# Patient Record
Sex: Female | Born: 1940 | Race: White | Hispanic: No | Marital: Married | Smoking: Never smoker
Health system: Southern US, Community
[De-identification: ages and names within clinical notes are randomized; demographics above are authoritative.]

## PROBLEM LIST (undated history)

## (undated) DIAGNOSIS — R32 Unspecified urinary incontinence: Secondary | ICD-10-CM

## (undated) DIAGNOSIS — R7303 Prediabetes: Secondary | ICD-10-CM

## (undated) DIAGNOSIS — K635 Polyp of colon: Secondary | ICD-10-CM

## (undated) DIAGNOSIS — C439 Malignant melanoma of skin, unspecified: Secondary | ICD-10-CM

## (undated) DIAGNOSIS — N952 Postmenopausal atrophic vaginitis: Secondary | ICD-10-CM

## (undated) DIAGNOSIS — D219 Benign neoplasm of connective and other soft tissue, unspecified: Secondary | ICD-10-CM

## (undated) DIAGNOSIS — J45909 Unspecified asthma, uncomplicated: Secondary | ICD-10-CM

## (undated) DIAGNOSIS — N3946 Mixed incontinence: Secondary | ICD-10-CM

## (undated) DIAGNOSIS — E7800 Pure hypercholesterolemia, unspecified: Secondary | ICD-10-CM

## (undated) DIAGNOSIS — K219 Gastro-esophageal reflux disease without esophagitis: Secondary | ICD-10-CM

## (undated) DIAGNOSIS — E119 Type 2 diabetes mellitus without complications: Secondary | ICD-10-CM

## (undated) DIAGNOSIS — F329 Major depressive disorder, single episode, unspecified: Secondary | ICD-10-CM

## (undated) DIAGNOSIS — M1990 Unspecified osteoarthritis, unspecified site: Secondary | ICD-10-CM

## (undated) DIAGNOSIS — IMO0002 Reserved for concepts with insufficient information to code with codable children: Secondary | ICD-10-CM

## (undated) DIAGNOSIS — N393 Stress incontinence (female) (male): Secondary | ICD-10-CM

## (undated) DIAGNOSIS — D509 Iron deficiency anemia, unspecified: Secondary | ICD-10-CM

## (undated) DIAGNOSIS — Z8719 Personal history of other diseases of the digestive system: Secondary | ICD-10-CM

## (undated) DIAGNOSIS — I10 Essential (primary) hypertension: Secondary | ICD-10-CM

## (undated) DIAGNOSIS — T8859XA Other complications of anesthesia, initial encounter: Secondary | ICD-10-CM

## (undated) DIAGNOSIS — R1032 Left lower quadrant pain: Secondary | ICD-10-CM

## (undated) DIAGNOSIS — F419 Anxiety disorder, unspecified: Secondary | ICD-10-CM

## (undated) DIAGNOSIS — M722 Plantar fascial fibromatosis: Secondary | ICD-10-CM

## (undated) DIAGNOSIS — F32A Depression, unspecified: Secondary | ICD-10-CM

## (undated) DIAGNOSIS — N816 Rectocele: Secondary | ICD-10-CM

## (undated) HISTORY — PX: KNEE ARTHROSCOPY: SUR90

## (undated) HISTORY — DX: Stress incontinence (female) (male): N39.3

## (undated) HISTORY — PX: TUBAL LIGATION: SHX77

## (undated) HISTORY — DX: Reserved for concepts with insufficient information to code with codable children: IMO0002

## (undated) HISTORY — DX: Depression, unspecified: F32.A

## (undated) HISTORY — DX: Postmenopausal atrophic vaginitis: N95.2

## (undated) HISTORY — DX: Benign neoplasm of connective and other soft tissue, unspecified: D21.9

## (undated) HISTORY — DX: Anxiety disorder, unspecified: F41.9

## (undated) HISTORY — DX: Gastro-esophageal reflux disease without esophagitis: K21.9

## (undated) HISTORY — DX: Pure hypercholesterolemia, unspecified: E78.00

## (undated) HISTORY — DX: Major depressive disorder, single episode, unspecified: F32.9

## (undated) HISTORY — DX: Malignant melanoma of skin, unspecified: C43.9

## (undated) HISTORY — PX: OTHER SURGICAL HISTORY: SHX169

## (undated) HISTORY — PX: ORIF FEMUR FRACTURE: SHX2119

## (undated) HISTORY — DX: Essential (primary) hypertension: I10

## (undated) HISTORY — DX: Unspecified urinary incontinence: R32

## (undated) HISTORY — DX: Rectocele: N81.6

## (undated) HISTORY — PX: SPINE SURGERY: SHX786

## (undated) HISTORY — PX: CHOLECYSTECTOMY: SHX55

## (undated) HISTORY — PX: TOTAL HIP ARTHROPLASTY: SHX124

## (undated) HISTORY — DX: Left lower quadrant pain: R10.32

---

## 2000-04-07 ENCOUNTER — Encounter: Payer: Self-pay | Admitting: Family Medicine

## 2000-04-07 ENCOUNTER — Encounter: Admission: RE | Admit: 2000-04-07 | Discharge: 2000-04-07 | Payer: Self-pay | Admitting: Family Medicine

## 2000-04-17 ENCOUNTER — Encounter: Admission: RE | Admit: 2000-04-17 | Discharge: 2000-07-16 | Payer: Self-pay | Admitting: Family Medicine

## 2003-10-06 ENCOUNTER — Encounter: Admission: RE | Admit: 2003-10-06 | Discharge: 2003-10-06 | Payer: Self-pay | Admitting: Family Medicine

## 2003-11-14 ENCOUNTER — Encounter (INDEPENDENT_AMBULATORY_CARE_PROVIDER_SITE_OTHER): Payer: Self-pay | Admitting: Specialist

## 2003-11-14 ENCOUNTER — Ambulatory Visit (HOSPITAL_COMMUNITY): Admission: RE | Admit: 2003-11-14 | Discharge: 2003-11-14 | Payer: Self-pay | Admitting: Gastroenterology

## 2004-09-24 ENCOUNTER — Ambulatory Visit: Payer: Self-pay | Admitting: Family Medicine

## 2005-05-06 ENCOUNTER — Ambulatory Visit: Payer: Self-pay | Admitting: Pain Medicine

## 2005-10-31 ENCOUNTER — Ambulatory Visit: Payer: Self-pay | Admitting: Family Medicine

## 2006-11-05 ENCOUNTER — Ambulatory Visit: Payer: Self-pay | Admitting: Family Medicine

## 2006-12-18 ENCOUNTER — Ambulatory Visit: Payer: Self-pay | Admitting: Family Medicine

## 2007-01-06 ENCOUNTER — Encounter: Payer: Self-pay | Admitting: Family Medicine

## 2007-01-09 ENCOUNTER — Encounter: Payer: Self-pay | Admitting: Family Medicine

## 2007-02-09 ENCOUNTER — Encounter: Payer: Self-pay | Admitting: Family Medicine

## 2007-04-15 ENCOUNTER — Ambulatory Visit: Payer: Self-pay | Admitting: Family Medicine

## 2007-10-13 ENCOUNTER — Ambulatory Visit: Payer: Self-pay | Admitting: General Practice

## 2007-11-12 ENCOUNTER — Ambulatory Visit: Payer: Self-pay | Admitting: Family Medicine

## 2007-11-17 ENCOUNTER — Ambulatory Visit: Payer: Self-pay | Admitting: Pain Medicine

## 2007-11-23 ENCOUNTER — Ambulatory Visit: Payer: Self-pay | Admitting: Pain Medicine

## 2007-12-09 ENCOUNTER — Ambulatory Visit: Payer: Self-pay | Admitting: Pain Medicine

## 2007-12-24 ENCOUNTER — Ambulatory Visit: Payer: Self-pay | Admitting: Pain Medicine

## 2007-12-28 ENCOUNTER — Ambulatory Visit: Payer: Self-pay | Admitting: Pain Medicine

## 2007-12-28 ENCOUNTER — Ambulatory Visit: Payer: Self-pay

## 2008-01-13 ENCOUNTER — Ambulatory Visit: Payer: Self-pay | Admitting: General Practice

## 2008-01-15 ENCOUNTER — Ambulatory Visit: Payer: Self-pay | Admitting: General Practice

## 2008-05-24 ENCOUNTER — Ambulatory Visit: Payer: Self-pay | Admitting: Gastroenterology

## 2008-06-10 HISTORY — PX: OTHER SURGICAL HISTORY: SHX169

## 2008-09-27 ENCOUNTER — Ambulatory Visit: Payer: Self-pay

## 2008-11-01 ENCOUNTER — Ambulatory Visit: Payer: Self-pay | Admitting: Pain Medicine

## 2008-11-14 ENCOUNTER — Ambulatory Visit: Payer: Self-pay | Admitting: Pain Medicine

## 2008-11-16 ENCOUNTER — Ambulatory Visit: Payer: Self-pay | Admitting: Family Medicine

## 2008-12-10 ENCOUNTER — Ambulatory Visit: Payer: Self-pay

## 2008-12-19 ENCOUNTER — Ambulatory Visit: Payer: Self-pay | Admitting: Pain Medicine

## 2009-04-27 ENCOUNTER — Ambulatory Visit: Payer: Self-pay | Admitting: General Practice

## 2009-05-10 ENCOUNTER — Ambulatory Visit: Payer: Self-pay | Admitting: General Practice

## 2009-09-13 ENCOUNTER — Ambulatory Visit: Payer: Self-pay | Admitting: Family Medicine

## 2009-10-05 ENCOUNTER — Ambulatory Visit: Payer: Self-pay | Admitting: Family Medicine

## 2009-10-20 ENCOUNTER — Ambulatory Visit: Payer: Self-pay | Admitting: Family Medicine

## 2009-12-13 ENCOUNTER — Inpatient Hospital Stay (HOSPITAL_COMMUNITY): Admission: RE | Admit: 2009-12-13 | Discharge: 2009-12-15 | Payer: Self-pay | Admitting: Neurosurgery

## 2010-01-09 ENCOUNTER — Encounter: Payer: Self-pay | Admitting: Family Medicine

## 2010-02-08 ENCOUNTER — Encounter: Payer: Self-pay | Admitting: Family Medicine

## 2010-04-03 ENCOUNTER — Inpatient Hospital Stay (HOSPITAL_COMMUNITY): Admission: RE | Admit: 2010-04-03 | Discharge: 2010-04-10 | Payer: Self-pay | Admitting: Orthopaedic Surgery

## 2010-04-05 ENCOUNTER — Encounter (INDEPENDENT_AMBULATORY_CARE_PROVIDER_SITE_OTHER): Payer: Self-pay | Admitting: Internal Medicine

## 2010-04-18 ENCOUNTER — Ambulatory Visit: Payer: Self-pay | Admitting: Vascular Surgery

## 2010-04-18 ENCOUNTER — Ambulatory Visit (HOSPITAL_COMMUNITY): Admission: RE | Admit: 2010-04-18 | Discharge: 2010-04-18 | Payer: Self-pay | Admitting: Orthopaedic Surgery

## 2010-04-18 ENCOUNTER — Encounter (INDEPENDENT_AMBULATORY_CARE_PROVIDER_SITE_OTHER): Payer: Self-pay | Admitting: Orthopaedic Surgery

## 2010-04-27 ENCOUNTER — Inpatient Hospital Stay (HOSPITAL_COMMUNITY)
Admission: AD | Admit: 2010-04-27 | Discharge: 2010-05-07 | Payer: Self-pay | Source: Home / Self Care | Admitting: Orthopaedic Surgery

## 2010-05-02 ENCOUNTER — Ambulatory Visit: Payer: Self-pay | Admitting: Physical Medicine & Rehabilitation

## 2010-05-07 ENCOUNTER — Inpatient Hospital Stay
Admission: AD | Admit: 2010-05-07 | Discharge: 2010-05-28 | Payer: Self-pay | Source: Home / Self Care | Attending: Internal Medicine | Admitting: Internal Medicine

## 2010-07-01 ENCOUNTER — Encounter (HOSPITAL_BASED_OUTPATIENT_CLINIC_OR_DEPARTMENT_OTHER): Payer: Self-pay | Admitting: Internal Medicine

## 2010-08-07 ENCOUNTER — Ambulatory Visit: Payer: Self-pay | Admitting: Family Medicine

## 2010-08-21 LAB — CBC
HCT: 26.4 % — ABNORMAL LOW (ref 36.0–46.0)
HCT: 29.8 % — ABNORMAL LOW (ref 36.0–46.0)
HCT: 29.8 % — ABNORMAL LOW (ref 36.0–46.0)
HCT: 31.2 % — ABNORMAL LOW (ref 36.0–46.0)
Hemoglobin: 10.4 g/dL — ABNORMAL LOW (ref 12.0–15.0)
Hemoglobin: 10.4 g/dL — ABNORMAL LOW (ref 12.0–15.0)
Hemoglobin: 9.4 g/dL — ABNORMAL LOW (ref 12.0–15.0)
MCH: 29.4 pg (ref 26.0–34.0)
MCH: 29.7 pg (ref 26.0–34.0)
MCH: 29.8 pg (ref 26.0–34.0)
MCHC: 31.5 g/dL (ref 30.0–36.0)
MCHC: 31.8 g/dL (ref 30.0–36.0)
MCHC: 32.1 g/dL (ref 30.0–36.0)
MCHC: 33.3 g/dL (ref 30.0–36.0)
MCHC: 33.3 g/dL (ref 30.0–36.0)
MCHC: 34.3 g/dL (ref 30.0–36.0)
MCV: 89.4 fL (ref 78.0–100.0)
MCV: 93.7 fL (ref 78.0–100.0)
Platelets: 267 10*3/uL (ref 150–400)
Platelets: 332 10*3/uL (ref 150–400)
Platelets: 432 10*3/uL — ABNORMAL HIGH (ref 150–400)
Platelets: 446 10*3/uL — ABNORMAL HIGH (ref 150–400)
Platelets: 567 10*3/uL — ABNORMAL HIGH (ref 150–400)
RBC: 3.18 MIL/uL — ABNORMAL LOW (ref 3.87–5.11)
RBC: 3.23 MIL/uL — ABNORMAL LOW (ref 3.87–5.11)
RBC: 3.46 MIL/uL — ABNORMAL LOW (ref 3.87–5.11)
RDW: 14.6 % (ref 11.5–15.5)
RDW: 14.6 % (ref 11.5–15.5)
RDW: 15.2 % (ref 11.5–15.5)
RDW: 15.3 % (ref 11.5–15.5)
WBC: 13.2 10*3/uL — ABNORMAL HIGH (ref 4.0–10.5)
WBC: 9.1 10*3/uL (ref 4.0–10.5)

## 2010-08-21 LAB — BASIC METABOLIC PANEL
BUN: 10 mg/dL (ref 6–23)
BUN: 14 mg/dL (ref 6–23)
BUN: 17 mg/dL (ref 6–23)
CO2: 21 mEq/L (ref 19–32)
Calcium: 8.1 mg/dL — ABNORMAL LOW (ref 8.4–10.5)
Calcium: 8.4 mg/dL (ref 8.4–10.5)
Calcium: 8.5 mg/dL (ref 8.4–10.5)
Chloride: 107 mEq/L (ref 96–112)
Creatinine, Ser: 0.73 mg/dL (ref 0.4–1.2)
Creatinine, Ser: 0.78 mg/dL (ref 0.4–1.2)
Creatinine, Ser: 0.84 mg/dL (ref 0.4–1.2)
Creatinine, Ser: 0.84 mg/dL (ref 0.4–1.2)
GFR calc Af Amer: >60 mL/min (ref >60)
GFR calc Af Amer: >60 mL/min (ref >60)
GFR calc non Af Amer: >60 mL/min (ref >60)
GFR calc non Af Amer: >60 mL/min (ref >60)
GFR calc non Af Amer: >60 mL/min (ref >60)
Glucose, Bld: 117 mg/dL — ABNORMAL HIGH (ref 70–99)
Glucose, Bld: 140 mg/dL — ABNORMAL HIGH (ref 70–99)
Glucose, Bld: 212 mg/dL — ABNORMAL HIGH (ref 70–99)
Potassium: 3.5 mEq/L (ref 3.5–5.1)
Potassium: 4.4 mEq/L (ref 3.5–5.1)
Sodium: 132 mEq/L — ABNORMAL LOW (ref 135–145)
Sodium: 136 mEq/L (ref 135–145)

## 2010-08-21 LAB — CROSSMATCH
ABO/RH(D): A POS
Antibody Screen: NEGATIVE
Unit division: 0
Unit division: 0
Unit division: 0

## 2010-08-21 LAB — URINALYSIS, MICROSCOPIC ONLY
Ketones, ur: NEGATIVE mg/dL
Nitrite: NEGATIVE
Protein, ur: NEGATIVE mg/dL
Urobilinogen, UA: >8 mg/dL — ABNORMAL HIGH (ref 0.0–1.0)

## 2010-08-21 LAB — PROTEIN ELECTROPH W RFLX QUANT IMMUNOGLOBULINS
Albumin ELP: 55.3 % — ABNORMAL LOW (ref 55.8–66.1)
Alpha-1-Globulin: 7.1 % — ABNORMAL HIGH (ref 2.9–4.9)
Alpha-2-Globulin: 14.6 % — ABNORMAL HIGH (ref 7.1–11.8)
Beta 2: 5.5 % (ref 3.2–6.5)
Beta Globulin: 6.3 % (ref 4.7–7.2)
Gamma Globulin: 11.2 % (ref 11.1–18.8)

## 2010-08-21 LAB — COMPREHENSIVE METABOLIC PANEL
ALT: 22 U/L (ref 0–35)
AST: 22 U/L (ref 0–37)
Albumin: 3.6 g/dL (ref 3.5–5.2)
BUN: 14 mg/dL (ref 6–23)
CO2: 25 mEq/L (ref 19–32)
CO2: 28 mEq/L (ref 19–32)
Calcium: 9.2 mg/dL (ref 8.4–10.5)
Calcium: 9.8 mg/dL (ref 8.4–10.5)
Creatinine, Ser: 0.73 mg/dL (ref 0.4–1.2)
Creatinine, Ser: 1.14 mg/dL (ref 0.4–1.2)
GFR calc Af Amer: 57 mL/min — ABNORMAL LOW (ref >60)
GFR calc Af Amer: >60 mL/min (ref >60)
GFR calc non Af Amer: 47 mL/min — ABNORMAL LOW (ref >60)
GFR calc non Af Amer: >60 mL/min (ref >60)
Glucose, Bld: 96 mg/dL (ref 70–99)
Sodium: 140 mEq/L (ref 135–145)
Total Protein: 5.6 g/dL — ABNORMAL LOW (ref 6.0–8.3)

## 2010-08-21 LAB — URINALYSIS, ROUTINE W REFLEX MICROSCOPIC
Glucose, UA: NEGATIVE mg/dL
Hgb urine dipstick: NEGATIVE
Ketones, ur: NEGATIVE mg/dL
pH: 6 (ref 5.0–8.0)

## 2010-08-21 LAB — WOUND CULTURE: Culture: NO GROWTH

## 2010-08-21 LAB — IRON AND TIBC: Saturation Ratios: 5 % — ABNORMAL LOW (ref 20–55)

## 2010-08-21 LAB — URINE CULTURE
Colony Count: >=100000
Culture  Setup Time: 201111212041

## 2010-08-21 LAB — PROTEIN, URINE, 24 HOUR: Protein, 24H Urine: 238 mg/d — ABNORMAL HIGH (ref 50–100)

## 2010-08-21 LAB — VITAMIN B12: Vitamin B-12: 284 pg/mL (ref 211–911)

## 2010-08-21 LAB — RETICULOCYTES
RBC.: 3.36 MIL/uL — ABNORMAL LOW (ref 3.87–5.11)
Retic Count, Absolute: 104.2 10*3/uL (ref 19.0–186.0)
Retic Ct Pct: 3.1 % (ref 0.4–3.1)

## 2010-08-21 LAB — PROTIME-INR
INR: 1.04 (ref 0.00–1.49)
INR: 1.5 — ABNORMAL HIGH (ref 0.00–1.49)
Prothrombin Time: 12.9 seconds (ref 11.6–15.2)

## 2010-08-21 LAB — VITAMIN D 25 HYDROXY (VIT D DEFICIENCY, FRACTURES): Vit D, 25-Hydroxy: 26 ng/mL — ABNORMAL LOW (ref 30–89)

## 2010-08-21 LAB — POCT I-STAT 4, (NA,K, GLUC, HGB,HCT)
Glucose, Bld: 120 mg/dL — ABNORMAL HIGH (ref 70–99)
Potassium: 4.4 mEq/L (ref 3.5–5.1)
Sodium: 139 mEq/L (ref 135–145)

## 2010-08-21 LAB — TSH: TSH: 1.051 u[IU]/mL (ref 0.350–4.500)

## 2010-08-21 LAB — VITAMIN D 1,25 DIHYDROXY: Vitamin D2 1, 25 (OH)2: <8 pg/mL

## 2010-08-21 LAB — APTT
aPTT: 26 seconds (ref 24–37)
aPTT: 33 seconds (ref 24–37)

## 2010-08-21 LAB — PREPARE RBC (CROSSMATCH)

## 2010-08-21 LAB — ANAEROBIC CULTURE

## 2010-08-22 LAB — CROSSMATCH
ABO/RH(D): A POS
Antibody Screen: NEGATIVE
Unit division: 0

## 2010-08-22 LAB — COMPREHENSIVE METABOLIC PANEL
ALT: 20 U/L (ref 0–35)
Albumin: 2.4 g/dL — ABNORMAL LOW (ref 3.5–5.2)
Alkaline Phosphatase: 48 U/L (ref 39–117)
Alkaline Phosphatase: 51 U/L (ref 39–117)
BUN: 15 mg/dL (ref 6–23)
BUN: 25 mg/dL — ABNORMAL HIGH (ref 6–23)
CO2: 23 mEq/L (ref 19–32)
Chloride: 106 mEq/L (ref 96–112)
Chloride: 111 mEq/L (ref 96–112)
GFR calc non Af Amer: >60 mL/min (ref >60)
Glucose, Bld: 105 mg/dL — ABNORMAL HIGH (ref 70–99)
Glucose, Bld: 107 mg/dL — ABNORMAL HIGH (ref 70–99)
Potassium: 3.8 mEq/L (ref 3.5–5.1)
Potassium: 4.1 mEq/L (ref 3.5–5.1)
Sodium: 139 mEq/L (ref 135–145)
Total Bilirubin: 0.5 mg/dL (ref 0.3–1.2)
Total Bilirubin: 0.6 mg/dL (ref 0.3–1.2)

## 2010-08-22 LAB — CBC
HCT: 24 % — ABNORMAL LOW (ref 36.0–46.0)
HCT: 24.4 % — ABNORMAL LOW (ref 36.0–46.0)
HCT: 25 % — ABNORMAL LOW (ref 36.0–46.0)
HCT: 27.1 % — ABNORMAL LOW (ref 36.0–46.0)
HCT: 36.6 % (ref 36.0–46.0)
Hemoglobin: 12 g/dL (ref 12.0–15.0)
Hemoglobin: 7.9 g/dL — ABNORMAL LOW (ref 12.0–15.0)
Hemoglobin: 8.4 g/dL — ABNORMAL LOW (ref 12.0–15.0)
MCH: 29.4 pg (ref 26.0–34.0)
MCH: 30 pg (ref 26.0–34.0)
MCH: 30.2 pg (ref 26.0–34.0)
MCHC: 32.8 g/dL (ref 30.0–36.0)
MCHC: 32.9 g/dL (ref 30.0–36.0)
MCV: 87.6 fL (ref 78.0–100.0)
MCV: 89.1 fL (ref 78.0–100.0)
MCV: 89.2 fL (ref 78.0–100.0)
MCV: 89.9 fL (ref 78.0–100.0)
MCV: 91.5 fL (ref 78.0–100.0)
Platelets: 192 10*3/uL (ref 150–400)
Platelets: 203 10*3/uL (ref 150–400)
Platelets: 217 10*3/uL (ref 150–400)
Platelets: 233 10*3/uL (ref 150–400)
Platelets: 370 K/uL (ref 150–400)
RBC: 2.73 MIL/uL — ABNORMAL LOW (ref 3.87–5.11)
RBC: 2.79 MIL/uL — ABNORMAL LOW (ref 3.87–5.11)
RBC: 2.81 MIL/uL — ABNORMAL LOW (ref 3.87–5.11)
RBC: 2.9 MIL/uL — ABNORMAL LOW (ref 3.87–5.11)
RBC: 3.04 MIL/uL — ABNORMAL LOW (ref 3.87–5.11)
RBC: 4 MIL/uL (ref 3.87–5.11)
RDW: 13.4 % (ref 11.5–15.5)
RDW: 13.9 % (ref 11.5–15.5)
RDW: 14 % (ref 11.5–15.5)
RDW: 14.1 % (ref 11.5–15.5)
RDW: 14.2 % (ref 11.5–15.5)
WBC: 7.4 10*3/uL (ref 4.0–10.5)
WBC: 7.7 K/uL (ref 4.0–10.5)
WBC: 8.3 10*3/uL (ref 4.0–10.5)
WBC: 8.8 10*3/uL (ref 4.0–10.5)

## 2010-08-22 LAB — BASIC METABOLIC PANEL
BUN: 13 mg/dL (ref 6–23)
CO2: 24 mEq/L (ref 19–32)
CO2: 25 mEq/L (ref 19–32)
Calcium: 8.2 mg/dL — ABNORMAL LOW (ref 8.4–10.5)
Calcium: 8.5 mg/dL (ref 8.4–10.5)
Chloride: 107 mEq/L (ref 96–112)
Chloride: 108 mEq/L (ref 96–112)
Chloride: 112 mEq/L (ref 96–112)
Creatinine, Ser: 2.29 mg/dL — ABNORMAL HIGH (ref 0.4–1.2)
GFR calc Af Amer: 25 mL/min — ABNORMAL LOW (ref >60)
GFR calc Af Amer: >60 mL/min (ref >60)
GFR calc Af Amer: >60 mL/min (ref >60)
GFR calc non Af Amer: 21 mL/min — ABNORMAL LOW (ref >60)
GFR calc non Af Amer: >60 mL/min (ref >60)
GFR calc non Af Amer: >60 mL/min (ref >60)
Glucose, Bld: 126 mg/dL — ABNORMAL HIGH (ref 70–99)
Potassium: 3.5 mEq/L (ref 3.5–5.1)
Potassium: 3.7 mEq/L (ref 3.5–5.1)
Potassium: 5.1 mEq/L (ref 3.5–5.1)
Sodium: 138 mEq/L (ref 135–145)
Sodium: 141 mEq/L (ref 135–145)

## 2010-08-22 LAB — URINE CULTURE

## 2010-08-22 LAB — HEMOGLOBIN AND HEMATOCRIT, BLOOD: Hemoglobin: 8.9 g/dL — ABNORMAL LOW (ref 12.0–15.0)

## 2010-08-22 LAB — URINE MICROSCOPIC-ADD ON

## 2010-08-22 LAB — COMPREHENSIVE METABOLIC PANEL WITH GFR
ALT: 18 U/L (ref 0–35)
AST: 20 U/L (ref 0–37)
Albumin: 4.3 g/dL (ref 3.5–5.2)
Alkaline Phosphatase: 81 U/L (ref 39–117)
BUN: 19 mg/dL (ref 6–23)
CO2: 27 meq/L (ref 19–32)
Calcium: 10 mg/dL (ref 8.4–10.5)
Chloride: 104 meq/L (ref 96–112)
Creatinine, Ser: 1.21 mg/dL — ABNORMAL HIGH (ref 0.4–1.2)
GFR calc non Af Amer: 44 mL/min — ABNORMAL LOW
Glucose, Bld: 91 mg/dL (ref 70–99)
Potassium: 3.9 meq/L (ref 3.5–5.1)
Sodium: 139 meq/L (ref 135–145)
Total Bilirubin: 0.8 mg/dL (ref 0.3–1.2)
Total Protein: 7.1 g/dL (ref 6.0–8.3)

## 2010-08-22 LAB — DIFFERENTIAL
Basophils Relative: 1 % (ref 0–1)
Eosinophils Absolute: 0.1 10*3/uL (ref 0.0–0.7)
Eosinophils Relative: 1 % (ref 0–5)
Monocytes Relative: 6 % (ref 3–12)
Neutrophils Relative %: 54 % (ref 43–77)

## 2010-08-22 LAB — CK
Total CK: 461 U/L — ABNORMAL HIGH (ref 7–177)
Total CK: 701 U/L — ABNORMAL HIGH (ref 7–177)

## 2010-08-22 LAB — CULTURE, BLOOD (ROUTINE X 2)
Culture  Setup Time: 201110272115
Culture: NO GROWTH

## 2010-08-22 LAB — MRSA PCR SCREENING: MRSA by PCR: NEGATIVE

## 2010-08-22 LAB — URINALYSIS, ROUTINE W REFLEX MICROSCOPIC
Bilirubin Urine: NEGATIVE
Glucose, UA: NEGATIVE mg/dL
Hgb urine dipstick: NEGATIVE
Hgb urine dipstick: NEGATIVE
Ketones, ur: NEGATIVE mg/dL
Nitrite: NEGATIVE
Protein, ur: NEGATIVE mg/dL
Specific Gravity, Urine: 1.013 (ref 1.005–1.030)
Urobilinogen, UA: 1 mg/dL (ref 0.0–1.0)
pH: 7 (ref 5.0–8.0)

## 2010-08-22 LAB — CARDIAC PANEL(CRET KIN+CKTOT+MB+TROPI)
Relative Index: 1.8 (ref 0.0–2.5)
Total CK: 3603 U/L — ABNORMAL HIGH (ref 7–177)
Troponin I: 0.02 ng/mL (ref 0.00–0.06)
Troponin I: 0.03 ng/mL (ref 0.00–0.06)

## 2010-08-22 LAB — SURGICAL PCR SCREEN
MRSA, PCR: NEGATIVE
Staphylococcus aureus: NEGATIVE

## 2010-08-22 LAB — PROTIME-INR: Prothrombin Time: 13 seconds (ref 11.6–15.2)

## 2010-08-26 LAB — COMPREHENSIVE METABOLIC PANEL
ALT: 24 U/L (ref 0–35)
AST: 24 U/L (ref 0–37)
Alkaline Phosphatase: 67 U/L (ref 39–117)
CO2: 25 mEq/L (ref 19–32)
Chloride: 103 mEq/L (ref 96–112)
GFR calc Af Amer: 59 mL/min — ABNORMAL LOW (ref >60)
GFR calc non Af Amer: 48 mL/min — ABNORMAL LOW (ref >60)
Potassium: 4.4 mEq/L (ref 3.5–5.1)
Sodium: 138 mEq/L (ref 135–145)
Total Bilirubin: 0.4 mg/dL (ref 0.3–1.2)

## 2010-08-26 LAB — DIFFERENTIAL
Basophils Absolute: 0.1 10*3/uL (ref 0.0–0.1)
Eosinophils Absolute: 0.1 10*3/uL (ref 0.0–0.7)
Eosinophils Relative: 2 % (ref 0–5)
Lymphs Abs: 2.4 10*3/uL (ref 0.7–4.0)

## 2010-08-26 LAB — CBC
Hemoglobin: 11.1 g/dL — ABNORMAL LOW (ref 12.0–15.0)
RBC: 3.59 MIL/uL — ABNORMAL LOW (ref 3.87–5.11)

## 2010-08-26 LAB — URINALYSIS, ROUTINE W REFLEX MICROSCOPIC
Bilirubin Urine: NEGATIVE
Nitrite: NEGATIVE
Specific Gravity, Urine: 1.02 (ref 1.005–1.030)
pH: 7 (ref 5.0–8.0)

## 2010-08-26 LAB — SURGICAL PCR SCREEN: MRSA, PCR: NEGATIVE

## 2010-08-26 LAB — TYPE AND SCREEN: Antibody Screen: NEGATIVE

## 2010-08-26 LAB — PROTIME-INR: Prothrombin Time: 12.5 seconds (ref 11.6–15.2)

## 2010-08-31 ENCOUNTER — Other Ambulatory Visit (HOSPITAL_COMMUNITY): Payer: Self-pay | Admitting: Orthopaedic Surgery

## 2010-08-31 DIAGNOSIS — S7291XA Unspecified fracture of right femur, initial encounter for closed fracture: Secondary | ICD-10-CM

## 2010-09-04 ENCOUNTER — Ambulatory Visit (HOSPITAL_COMMUNITY)
Admission: RE | Admit: 2010-09-04 | Discharge: 2010-09-04 | Disposition: A | Discharge status: Home / Self Care | Payer: Medicare Other | Source: Ambulatory Visit | Attending: Orthopaedic Surgery | Admitting: Orthopaedic Surgery

## 2010-09-04 ENCOUNTER — Encounter (HOSPITAL_COMMUNITY)
Admission: RE | Admit: 2010-09-04 | Discharge: 2010-09-04 | Disposition: A | Discharge status: Home / Self Care | Payer: Medicare Other | Source: Ambulatory Visit | Attending: Orthopaedic Surgery | Admitting: Orthopaedic Surgery

## 2010-09-04 DIAGNOSIS — S92009A Unspecified fracture of unspecified calcaneus, initial encounter for closed fracture: Secondary | ICD-10-CM | POA: Insufficient documentation

## 2010-09-04 DIAGNOSIS — S7291XA Unspecified fracture of right femur, initial encounter for closed fracture: Secondary | ICD-10-CM

## 2010-09-04 DIAGNOSIS — Z96649 Presence of unspecified artificial hip joint: Secondary | ICD-10-CM | POA: Insufficient documentation

## 2010-09-04 DIAGNOSIS — X58XXXA Exposure to other specified factors, initial encounter: Secondary | ICD-10-CM | POA: Insufficient documentation

## 2010-09-04 DIAGNOSIS — S7290XA Unspecified fracture of unspecified femur, initial encounter for closed fracture: Secondary | ICD-10-CM | POA: Insufficient documentation

## 2010-09-04 DIAGNOSIS — S92309A Fracture of unspecified metatarsal bone(s), unspecified foot, initial encounter for closed fracture: Secondary | ICD-10-CM | POA: Insufficient documentation

## 2010-09-04 DIAGNOSIS — S92209A Fracture of unspecified tarsal bone(s) of unspecified foot, initial encounter for closed fracture: Secondary | ICD-10-CM | POA: Insufficient documentation

## 2010-09-04 MED ORDER — TECHNETIUM TC 99M MEDRONATE IV KIT
25.0000 | PACK | Freq: Once | INTRAVENOUS | Status: AC | PRN
  Administered 2010-09-04: 25 via INTRAVENOUS

## 2010-10-26 NOTE — Op Note (Signed)
Vanessa Newman, Vanessa Newman NO.:  [id number]   MEDICAL RECORD NO.:  [id number]                   PATIENT TYPE:  AMB   LOCATION:  ENDO                                 FACILITY:  [facility]   PHYSICIAN:  Danise Edge, M.D.                DATE OF BIRTH:  [birth year]   DATE OF PROCEDURE:  11/14/2003  DATE OF DISCHARGE:                                 OPERATIVE REPORT   PROCEDURE:  Screening colonoscopy.   REFERRING PHYSICIAN:  Clydie Braun L. Hal Hope, M.D. of [facility].   INDICATIONS FOR PROCEDURE:  Ms. Vanessa Newman is a 70 year old female born on  January 27, 1941.  Ms. Vanessa Newman is scheduled to undergo her first screening  colonoscopy with polypectomy to prevent colon cancer.   ENDOSCOPIST:  Danise Edge, M.D.   PREMEDICATION:  Versed 7.5 mg, Demerol 50 mg.   PROCEDURE:  After obtaining informed consent, Ms. Vanessa Newman was placed in the  left lateral decubitus position.  I administered intravenous Demerol and  intravenous Versed to achieve conscious sedation for the procedure.  The  patient's blood pressure, oxygen saturation, and cardiac rhythm were  monitored throughout the procedure and documented in the medical record.   Anal inspection and digital rectal exam was normal.  The Olympus adjustable  pediatric colonoscope was introduced into the rectum and advanced to the  cecum.  Colonic preparation for the exam today was satisfactory.   RECTUM:  Normal.   SIGMOID COLON/DESCENDING COLON:  Left colonic diverticulosis.   SPLENIC FLEXURE:  Normal.   TRANSVERSE COLON:  Normal.   HEPATIC FLEXURE:  Normal.   ASCENDING COLON:  Normal.   CECUM AND ILEOCECAL VALVE:  From the distal cecum, a 1 mm sessile polyp was  removed with cold biopsy forceps.   ASSESSMENT:  1. Left colonic diverticulosis.  2. A diminutive polyp was removed from the distal cecum.                                               Danise Edge, M.D.    MJ/MEDQ  D:  11/14/2003   T:  11/14/2003  Job:  [id number]   cc:   Clydie Braun L. Hal Hope, M.D.  [address] [place]  [place] [address]  Fax: [phone]

## 2011-04-27 IMAGING — CR DG PORTABLE PELVIS
1 series · 1 of 1 positions shown · non-contrast
Comparison: 04/03/2010.

CLINICAL DATA: Right femur fracture.

PORTABLE PELVIS,PORTABLE RIGHT FEMUR - 2 VIEW

[AP]
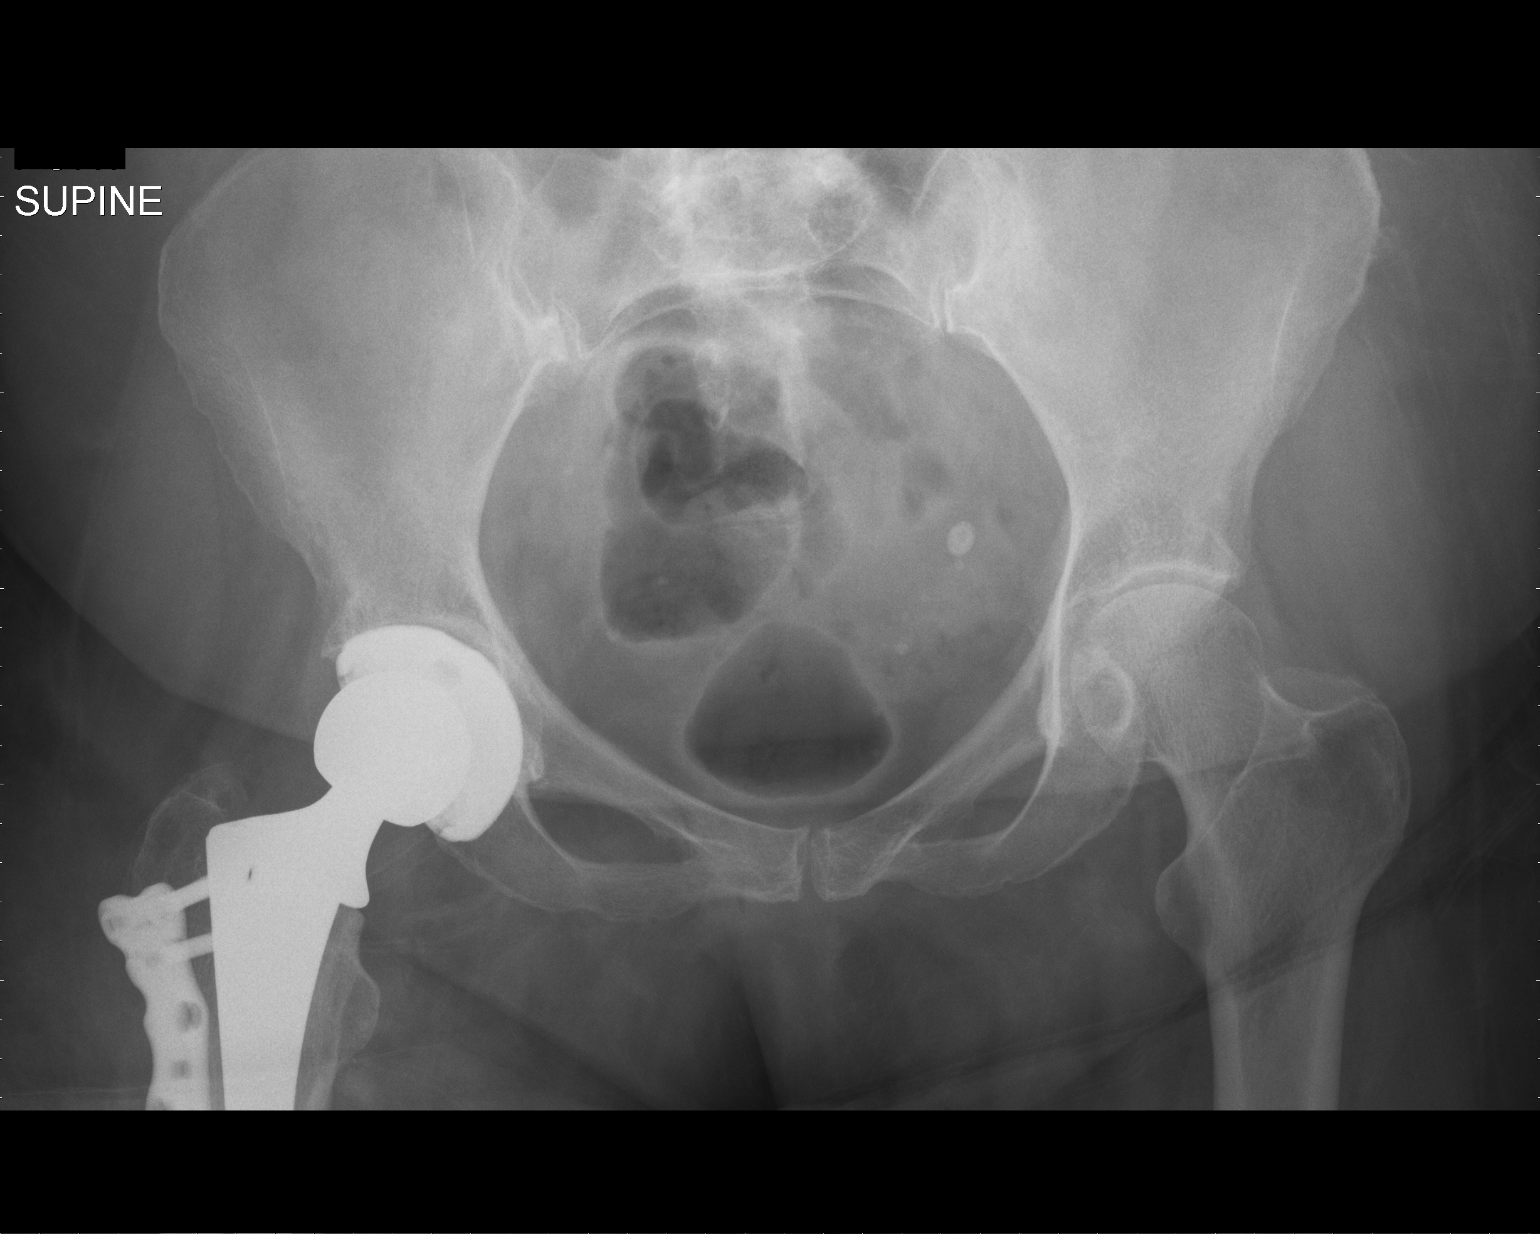

[1 of 1 positions shown; findings below may reference images not displayed]

FINDINGS: Prior right hip replacement.  Interval placement of a
long side plate, screws and poor cerclage wires along the right
femoral shaft.  Evaluation  of the proximal femur on cross-table
lateral view is slightly under penetrated.

Significant degenerative changes lateral right tibial femoral joint
space.
IMPRESSION: Prior right hip replacement.

Interval placement of a long side plate, screws and poor cerclage
wires along the right femoral shaft.

Significant degenerative changes lateral right tibial femoral joint
space.

## 2011-06-11 HISTORY — PX: TOTAL HIP ARTHROPLASTY: SHX124

## 2011-06-21 ENCOUNTER — Ambulatory Visit: Payer: Self-pay | Admitting: Unknown Physician Specialty

## 2011-08-09 ENCOUNTER — Ambulatory Visit: Payer: Self-pay | Admitting: Family Medicine

## 2011-08-13 ENCOUNTER — Ambulatory Visit: Payer: Self-pay | Admitting: Unknown Physician Specialty

## 2011-08-13 LAB — URINALYSIS, COMPLETE
Bilirubin,UR: NEGATIVE
Glucose,UR: NEGATIVE mg/dL (ref 0–75)
Ketone: NEGATIVE
Protein: NEGATIVE
RBC,UR: 8 /HPF (ref 0–5)
Squamous Epithelial: 2
WBC UR: 1 /HPF (ref 0–5)

## 2011-08-13 LAB — BASIC METABOLIC PANEL
Chloride: 106 mmol/L (ref 98–107)
Co2: 28 mmol/L (ref 21–32)
EGFR (African American): >60
Glucose: 96 mg/dL (ref 65–99)

## 2011-08-13 LAB — CBC
HGB: 11.4 g/dL — ABNORMAL LOW (ref 12.0–16.0)
MCH: 28.1 pg (ref 26.0–34.0)
MCV: 87 fL (ref 80–100)
RBC: 4.07 10*6/uL (ref 3.80–5.20)

## 2011-08-13 LAB — MRSA PCR SCREENING

## 2011-08-13 LAB — PROTIME-INR: Prothrombin Time: 12.2 secs (ref 11.5–14.7)

## 2011-08-28 ENCOUNTER — Inpatient Hospital Stay: Payer: Self-pay | Admitting: Unknown Physician Specialty

## 2011-08-28 LAB — ELECTROLYTE PANEL
Anion Gap: 12 (ref 7–16)
Co2: 27 mmol/L (ref 21–32)
Potassium: 3.7 mmol/L (ref 3.5–5.1)
Sodium: 144 mmol/L (ref 136–145)

## 2011-08-28 LAB — HEMOGLOBIN: HGB: 9.9 g/dL — ABNORMAL LOW (ref 12.0–16.0)

## 2011-08-29 LAB — BASIC METABOLIC PANEL
Calcium, Total: 8.1 mg/dL — ABNORMAL LOW (ref 8.5–10.1)
Chloride: 102 mmol/L (ref 98–107)
Co2: 28 mmol/L (ref 21–32)
EGFR (African American): >60
Glucose: 122 mg/dL — ABNORMAL HIGH (ref 65–99)
Potassium: 3.7 mmol/L (ref 3.5–5.1)
Sodium: 140 mmol/L (ref 136–145)

## 2011-08-29 LAB — HEMOGLOBIN: HGB: 9.2 g/dL — ABNORMAL LOW (ref 12.0–16.0)

## 2011-12-27 ENCOUNTER — Emergency Department: Payer: Self-pay | Admitting: Emergency Medicine

## 2011-12-27 LAB — BASIC METABOLIC PANEL
Anion Gap: 7 (ref 7–16)
BUN: 18 mg/dL (ref 7–18)
Chloride: 103 mmol/L (ref 98–107)
Creatinine: 1.04 mg/dL (ref 0.60–1.30)
EGFR (African American): >60
Osmolality: 281 (ref 275–301)

## 2011-12-27 LAB — CBC
MCH: 26.3 pg (ref 26.0–34.0)
MCV: 83 fL (ref 80–100)
Platelet: 344 10*3/uL (ref 150–440)
RBC: 4.12 10*6/uL (ref 3.80–5.20)

## 2012-07-27 ENCOUNTER — Ambulatory Visit: Payer: Self-pay | Admitting: Ophthalmology

## 2012-07-27 LAB — POTASSIUM: Potassium: 4 mmol/L (ref 3.5–5.1)

## 2012-08-12 ENCOUNTER — Ambulatory Visit: Payer: Self-pay | Admitting: Family Medicine

## 2012-08-19 ENCOUNTER — Ambulatory Visit: Payer: Self-pay | Admitting: Ophthalmology

## 2013-03-02 ENCOUNTER — Encounter: Payer: Self-pay | Admitting: Family Medicine

## 2013-03-10 ENCOUNTER — Encounter: Payer: Self-pay | Admitting: Family Medicine

## 2013-04-10 ENCOUNTER — Encounter: Payer: Self-pay | Admitting: Family Medicine

## 2013-05-19 ENCOUNTER — Ambulatory Visit: Payer: Self-pay | Admitting: Ophthalmology

## 2013-06-17 ENCOUNTER — Ambulatory Visit: Payer: Self-pay | Admitting: Gastroenterology

## 2013-08-13 ENCOUNTER — Ambulatory Visit: Payer: Self-pay | Admitting: Family Medicine

## 2013-09-09 DIAGNOSIS — M7918 Myalgia, other site: Secondary | ICD-10-CM | POA: Insufficient documentation

## 2013-09-09 DIAGNOSIS — F329 Major depressive disorder, single episode, unspecified: Secondary | ICD-10-CM | POA: Insufficient documentation

## 2013-09-09 DIAGNOSIS — K219 Gastro-esophageal reflux disease without esophagitis: Secondary | ICD-10-CM | POA: Insufficient documentation

## 2013-09-09 DIAGNOSIS — M659 Synovitis and tenosynovitis, unspecified: Secondary | ICD-10-CM | POA: Insufficient documentation

## 2013-09-09 DIAGNOSIS — G2581 Restless legs syndrome: Secondary | ICD-10-CM | POA: Insufficient documentation

## 2013-09-09 DIAGNOSIS — E785 Hyperlipidemia, unspecified: Secondary | ICD-10-CM | POA: Insufficient documentation

## 2013-09-09 DIAGNOSIS — M48061 Spinal stenosis, lumbar region without neurogenic claudication: Secondary | ICD-10-CM | POA: Insufficient documentation

## 2013-09-09 DIAGNOSIS — M722 Plantar fascial fibromatosis: Secondary | ICD-10-CM | POA: Insufficient documentation

## 2013-09-09 DIAGNOSIS — I10 Essential (primary) hypertension: Secondary | ICD-10-CM | POA: Insufficient documentation

## 2013-09-09 DIAGNOSIS — F32A Depression, unspecified: Secondary | ICD-10-CM | POA: Insufficient documentation

## 2013-09-27 DIAGNOSIS — Z85828 Personal history of other malignant neoplasm of skin: Secondary | ICD-10-CM | POA: Insufficient documentation

## 2013-09-28 ENCOUNTER — Ambulatory Visit: Payer: Self-pay

## 2013-12-21 ENCOUNTER — Encounter: Payer: Self-pay | Admitting: Obstetrics and Gynecology

## 2014-01-08 ENCOUNTER — Encounter: Payer: Self-pay | Admitting: Obstetrics and Gynecology

## 2014-01-12 DIAGNOSIS — R079 Chest pain, unspecified: Secondary | ICD-10-CM | POA: Insufficient documentation

## 2014-01-12 DIAGNOSIS — R0609 Other forms of dyspnea: Secondary | ICD-10-CM | POA: Insufficient documentation

## 2014-02-08 ENCOUNTER — Encounter: Payer: Self-pay | Admitting: Obstetrics and Gynecology

## 2014-06-07 ENCOUNTER — Ambulatory Visit: Payer: Self-pay | Admitting: Obstetrics and Gynecology

## 2014-08-15 ENCOUNTER — Ambulatory Visit: Payer: Self-pay | Admitting: Family Medicine

## 2014-09-30 NOTE — Op Note (Signed)
PATIENT NAME:  Vanessa Newman, Vanessa Newman MR#:  [id number] DATE OF BIRTH:  08/05/1940  DATE OF PROCEDURE:  08/19/2012  PREOPERATIVE DIAGNOSIS:  Senile cataract right eye.  POSTOPERATIVE DIAGNOSIS:  Senile cataract right eye.  PROCEDURE:  Phacoemulsification with posterior chamber intraocular lens implantation of the right eye.  LENS:  ZCBOO 24.0-diopter posterior chamber intraocular lens.  ULTRASOUND TIME:  15% of 1 minutes and 12 seconds.  CDE 10.9  SURGEON:  Mali Brasington, MD  ANESTHESIA:  Topical with tetracaine drops and 2% Xylocaine jelly.  COMPLICATIONS:  None.  DESCRIPTION OF PROCEDURE:  The patient was identified in the holding room and transported to the operating room and placed in the supine position under the operating microscope.  The right eye was identified as the operative eye and it was prepped and draped in the usual sterile ophthalmic fashion.  A 1 millimeter clear-corneal paracentesis was made at the 12 o'clock position.  The anterior chamber was filled withViscoat viscoelastic.  A 2.4 millimeter keratome was used to make a near-clear corneal incision at the 9 o'clock position.  A curvilinear capsulorrhexis was made with a cystotome and capsulorrhexis forceps.  Balanced salt solution was used to hydrodissect and hydrodelineate the nucleus.  Phacoemulsification was then used in stop and chop fashion to remove the lens nucleus and epinucleus.  The remaining cortex was then removed using the irrigation and aspiration handpiece. Provisc was then placed into the capsular bag to distend it for lens placement.  A ZCBOO 24.0-diopter lens was then injected into the capsular bag.  The remaining viscoelastic was aspirated.  Wounds were hydrated with balanced salt solution.  The anterior chamber was inflated to a physiologic pressure with balanced salt solution.  0.1 mL of cefuroxime 10 mg/mL were injected into the anterior chamber for a dose of 1 mg of intracameral antibiotic at the completion  of the case. Miostat was placed into the anterior chamber to constrict the pupil.  No wound leaks were noted.  Topical Vigamox drops and Maxitrol ointment were applied to the eye.  The patient was taken to the recovery room in stable condition without complications of anesthesia or surgery.     ____________________________ Wyonia Hough, MD crb:cs D: 08/19/2012 15:28:57 ET T: 08/19/2012 15:48:11 ET JOB#: [id number]  cc: Wyonia Hough, MD, <Dictator> Leandrew Koyanagi MD ELECTRONICALLY SIGNED 08/19/2012 16:13

## 2014-10-02 NOTE — Op Note (Signed)
PATIENT NAME:  Vanessa Newman, Vanessa Newman MR#:  [id number] DATE OF BIRTH:  1940/07/01  DATE OF PROCEDURE:  08/28/2011  PREOPERATIVE DIAGNOSIS: Severe degenerative arthritis, right knee.   POSTOPERATIVE DIAGNOSIS: Severe degenerative arthritis, right knee.   OPERATION: Stryker PS total knee replacement, on the right.   SURGEON: Kathrene Alu., M.D.   FIRST ASSISTANT: Dorthula Matas, PA-C  ANESTHESIA: Spinal plus general.   HISTORY: The patient had a long history of degenerative arthritis of the right knee. She had rather severe involvement of the right knee, particularly her lateral compartment.   Because the patient has been refractory to conservative treatment, she was ultimately brought in for a total knee procedure on the right.   DESCRIPTION OF PROCEDURE: The patient was taken to the Operating Room where satisfactory spinal anesthesia was achieved. A tourniquet was applied to the patient's right thigh and then the right lower extremity was prepped and draped in the usual fashion for a procedure about the knee. The patient incidentally was given 2 grams Kefzol IV prior to the start of the procedure.   Next, the right lower extremity was exsanguinated and the tourniquet was inflated. A slightly curved longitudinal incision was made over the anterior aspect of the right knee joint. Dissection was carried down through the subcutaneous tissue onto the extensor mechanism. The vastus medialis was divided in line with its fibers about a fingerbreadth above the superomedial border of the patella. The joint was entered. The incision was extended along the medial border of the patellar and medial border of the patellar tendon. The patella was everted and dislocated laterally as the knee was flexed. Inspection of the joint revealed the patient had significant osteophytes about the peripheral aspect of the distal femur and intercondylar notch. These osteophytes were removed. She had worn the lateral  femoral condyle virtually to the bone and there was a deep indentation of her lateral tibial plateau. There was some partial cartilage loss referable to the weight-bearing portion of the medial femoral condyle.    The distal femoral vertical cut was made through a cutting block that was positioned and pinned using the Stryker navigation system.  Our starting holes for the #8  4- in- 1 cutting block  were made utilizing a #9 cutting block.  We did this to try to prevent notching the anterior aspect of the distal femur.   The #8 cutting block was placed in slight external rotation. The anterior and posterior cuts were made along with the anterior and posterior chamfer cuts.   I notched out the intercondylar area using the appropriate jig.The cruciates were excised. The knee was hyperflexed. Meniscal remnants were excised. About a 3/8 inch hole was made in the midline of the proximal tibia, at the junction of the anterior one third and posterior two thirds. Intramedullary rod was inserted. On the proximal end of the rod was a stylus and 0 degree sloped cutting block.   The cutting block was positioned for a cut 2 mm below the lowest portion of the lateral tibial plateau. Once the cutting block was in appropriate position, it was affixed to the proximal tibia with smooth pins. The intramedullary rod and stylus were removed. The proximal tibia cut was made without difficulty.   We templated the proximal tibia for a #7 tibial tray.   I went ahead and inserted the trial components. These were a #7 tibial tray, a #8 PS femoral component, and a 10 mm thick tibial bearing insert. With these trial  components in place, the knee came to virtual full extension. It seemed to be reasonably stable.   The tourniquet was released at this time. It had been up 82 minutes. Bleeding was controlled with coagulation cautery.  I went ahead and removed about 8 mm of bone from the retropatellar surface. We templated the  retropatellar surface for a #7, 10 mm thick resurfacing patellar component. Three holes were made for the trial #7 and with the #7 in place the patella tracked well.   At this time the tourniquet had been down about 14 to 15 minutes.   I went ahead and re-exsanguinated the right lower extremity and inflated the tourniquet. The trial components were removed. With the knee hyperflexed, I used the tibial baseplate as a guide for our cruciate cut, in the proximal tibia. After the cruciate cut was made, jet lavage was used to remove blood from the cut cancellous surfaces. Next, I sequentially implanted the components. The #7 tibial baseplate was impacted onto the proximal tibia with methylmethacrylate. The #8 femoral component was impacted onto the distal femur with methylmethacrylate. Excess glue was removed. A 10 mm trial spacer was inserted and the knee was extended. I then used methylmethacrylate to glue the #7 resurfacing 10 mm thick right patella component onto the retropatellar surface. Again excess glue was removed.   I then removed the trial 10 mm spacer and impacted the permanent 10 mm polyethylene PS tibial bearing insert onto the tibial baseplate. Once again the knee moved well and seemed to be stable.   The tourniquet was released. It was up 39 minutes the second time. Bleeding was again controlled with coagulation cautery. The wound was irrigated once more with GU irrigant and then two Autovac tubes were inserted into the depths of the wound. The capsule was closed with #1 Ethibond and #1 Vicryl sutures. The subcutaneous was closed with #2-0 Vicryl and the skin with skin staples. Betadine was applied to the wounds Sterile dressing was also applied along with four TENS pads. A Polar Care cooling pad was applied around the right knee and then a knee immobilizer was applied.   The patient was then awakened and transferred to her stretcher bed. She was taken to the Recovery Room in satisfactory  condition. Blood loss was about 300 mL. No blood was transfused during the course of the procedure.   Incidentally, the patient did require general anesthesia later in the case.              SUMMARY OF IMPLANTS USED: #8 PS Scorpio femoral component, #7 standard tibial tray, #7 10-mm thick resurfacing patellar component, and #7 10-mm thick PS tibial bearing insert.   The bone cement that was used did have tobramycin in it. ____________________________ Kathrene Alu., MD hbk:slb D: 08/28/2011 13:52:56 ET T: 08/28/2011 14:21:54 ET JOB#: [id number]  cc: Kathrene Alu., MD, <Dictator> Vilinda Flake, Brooke Bonito MD ELECTRONICALLY SIGNED 09/01/2011 18:26

## 2014-10-02 NOTE — Discharge Summary (Signed)
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   ATTENDING: Dr. Leanor Kail with [facility] orthopedics.   PROCEDURE: On 03/20 the patient underwent right total knee arthroplasty by Dr. Jefm Newman with assistant Vanessa Newman.   ANESTHESIA: General with spinal.   OPERATIVE FINDINGS: Severe degenerative arthritis.   IMPLANTS: By Stryker. Navigation was used for femoral component.   DRAINS: Autovac drain was placed.   ESTIMATED BLOOD LOSS: 300 mL.   COMPLICATIONS: There were no complications.   PATIENT HISTORY: Vanessa Newman is a 74 year old with a long history of degenerative arthritis of the lateral compartment of her right knee. She had had increasing pain laterally and increased valgus attitude of her knee. On her last visit varus stress x-ray was performed. This revealed neutral alignment was achieved with gentle varus stress. The patient had had a remote plating of her femur for femoral fracture. She had also had a right total hip replacement previously.   ALLERGIES AND ADVERSE REACTIONS: None.   PAST MEDICAL HISTORY:  1. Depression. 2. Hypertension.  3. Hyperlipidemia.  4. Acid reflux.  5. Restless leg syndrome. 6. Lumbar spinal stenosis.   PHYSICAL EXAMINATION: HEART: Normal sinus rhythm without murmur. LUNGS: Clear to auscultation. RIGHT KNEE: She had about a 15 degree of valgus attitude of her right knee. She had about a 25 degree flexion deformity. She had further active and passive flexion to 105 degrees. She was tender about her lateral joint line. No real parapatellar tenderness. Valgus deformity and was correctable to neutral. She had palpable distal pulses.   HOSPITAL COURSE: Patient  underwent aforementioned procedure without complication on the 76BJ and was transferred to the PAC-U and then the orthopedic floor in stable condition. Hemoglobin was 9.2 first day postop. Surprisingly the patient's pain was well under control during her time here. Her hemoglobin was 8.8 on the second day postop and no transfusion was deemed necessary. She was treated with Lovenox and TED hose as well as AV-I boots for deep vein thrombosis prophylaxis. Her Hemovac drain was pulled on day two postop without complication. Patient had been seen by Vanessa Newman, one of the other PAs in our group, on the last day she was here. He had changed her right knee dressing and there was certainly no sign of infection. Her staples were intact. The patient had tolerated her diet well while here. She did pass some stool before leaving. Her Foley catheter was out in good time. She worked with physical therapy on multiple occasions while here and ambulated 260 feet on the 22nd and then 150 feet on 03/23.   CONDITION AT DISCHARGE: Stable.   DISPOSITION: Home with home health physical therapy.    DISCHARGE MEDICATIONS:  1. Oxycodone 5 to 10 mg every four hours as needed for pain. She may take Tylenol with this.  2. Lovenox 40 mg subcutaneous injection once a day.   DISCHARGE INSTRUCTIONS AND FOLLOW UP:  1. Weight bearing as tolerated on the surgical leg.  2. She is to elevate the leg and wear TED hose during the day.  3. Regular diet with no concentrated sweets or sugar.  4. She will leave her right knee dressing on and keep it clean and dry but may use Polar Care unit.  5. She will call our office for any disturbing symptoms.  6. She will call our office to be seen postoperatively in two weeks for staple removal.  7. She will resume aspirin when finished with Lovenox injections.  8. She will take oxycodone, hydrocodone as needed for pain. She is informed hydrocodone does have Tylenol with it.    ____________________________ Vanessa Newman, Newman Vanessa Newman D: 09/03/2011 11:55:43 ET T: 09/04/2011 10:20:53 ET JOB#: [id number]  cc: Vanessa Newman, Newman, <Dictator> Vanessa Ivory Devyne Hauger PA ELECTRONICALLY SIGNED 09/07/2011 10:45

## 2014-11-23 ENCOUNTER — Ambulatory Visit: Payer: Self-pay | Admitting: Obstetrics and Gynecology

## 2015-05-30 DIAGNOSIS — N3946 Mixed incontinence: Secondary | ICD-10-CM | POA: Insufficient documentation

## 2015-08-07 ENCOUNTER — Other Ambulatory Visit: Payer: Self-pay | Admitting: Family Medicine

## 2015-08-07 DIAGNOSIS — Z1231 Encounter for screening mammogram for malignant neoplasm of breast: Secondary | ICD-10-CM

## 2015-08-09 ENCOUNTER — Other Ambulatory Visit: Payer: Self-pay | Admitting: Neurology

## 2015-08-09 DIAGNOSIS — M542 Cervicalgia: Secondary | ICD-10-CM

## 2015-08-17 ENCOUNTER — Other Ambulatory Visit: Payer: Self-pay | Admitting: Family Medicine

## 2015-08-17 ENCOUNTER — Ambulatory Visit
Admission: RE | Admit: 2015-08-17 | Discharge: 2015-08-17 | Disposition: A | Discharge status: Home / Self Care | Payer: Commercial Managed Care - HMO | Source: Ambulatory Visit | Attending: Family Medicine | Admitting: Family Medicine

## 2015-08-17 DIAGNOSIS — Z1231 Encounter for screening mammogram for malignant neoplasm of breast: Secondary | ICD-10-CM

## 2015-08-29 ENCOUNTER — Ambulatory Visit: Payer: Self-pay

## 2015-09-12 ENCOUNTER — Ambulatory Visit
Admission: RE | Admit: 2015-09-12 | Discharge: 2015-09-12 | Disposition: A | Discharge status: Home / Self Care | Payer: Commercial Managed Care - HMO | Source: Ambulatory Visit | Attending: Neurology | Admitting: Neurology

## 2015-09-12 DIAGNOSIS — M79601 Pain in right arm: Secondary | ICD-10-CM | POA: Insufficient documentation

## 2015-09-12 DIAGNOSIS — M50321 Other cervical disc degeneration at C4-C5 level: Secondary | ICD-10-CM | POA: Diagnosis not present

## 2015-09-12 DIAGNOSIS — M50322 Other cervical disc degeneration at C5-C6 level: Secondary | ICD-10-CM | POA: Insufficient documentation

## 2015-09-12 DIAGNOSIS — M79604 Pain in right leg: Secondary | ICD-10-CM | POA: Diagnosis not present

## 2015-09-12 DIAGNOSIS — M47894 Other spondylosis, thoracic region: Secondary | ICD-10-CM | POA: Insufficient documentation

## 2015-09-12 DIAGNOSIS — M47892 Other spondylosis, cervical region: Secondary | ICD-10-CM | POA: Diagnosis not present

## 2015-09-12 DIAGNOSIS — M542 Cervicalgia: Secondary | ICD-10-CM | POA: Diagnosis present

## 2015-09-12 DIAGNOSIS — M79602 Pain in left arm: Secondary | ICD-10-CM | POA: Insufficient documentation

## 2015-09-12 DIAGNOSIS — M50323 Other cervical disc degeneration at C6-C7 level: Secondary | ICD-10-CM | POA: Insufficient documentation

## 2016-02-05 ENCOUNTER — Ambulatory Visit (INDEPENDENT_AMBULATORY_CARE_PROVIDER_SITE_OTHER): Payer: Medicare HMO | Admitting: Otolaryngology

## 2016-02-05 DIAGNOSIS — H903 Sensorineural hearing loss, bilateral: Secondary | ICD-10-CM

## 2016-02-05 DIAGNOSIS — H6123 Impacted cerumen, bilateral: Secondary | ICD-10-CM | POA: Diagnosis not present

## 2016-07-10 ENCOUNTER — Other Ambulatory Visit: Payer: Self-pay | Admitting: Family Medicine

## 2016-07-10 DIAGNOSIS — Z1231 Encounter for screening mammogram for malignant neoplasm of breast: Secondary | ICD-10-CM

## 2016-08-22 ENCOUNTER — Ambulatory Visit
Admission: RE | Admit: 2016-08-22 | Discharge: 2016-08-22 | Disposition: A | Discharge status: Home / Self Care | Payer: Commercial Managed Care - HMO | Source: Ambulatory Visit | Attending: Family Medicine | Admitting: Family Medicine

## 2016-08-22 DIAGNOSIS — Z1231 Encounter for screening mammogram for malignant neoplasm of breast: Secondary | ICD-10-CM | POA: Diagnosis present

## 2016-09-06 ENCOUNTER — Emergency Department
Admission: EM | Admit: 2016-09-06 | Discharge: 2016-09-06 | Disposition: A | Discharge status: Home / Self Care | Payer: Commercial Managed Care - HMO | Attending: Emergency Medicine | Admitting: Emergency Medicine

## 2016-09-06 ENCOUNTER — Emergency Department: Payer: Commercial Managed Care - HMO

## 2016-09-06 ENCOUNTER — Encounter: Payer: Self-pay | Admitting: Emergency Medicine

## 2016-09-06 DIAGNOSIS — S86912A Strain of unspecified muscle(s) and tendon(s) at lower leg level, left leg, initial encounter: Secondary | ICD-10-CM | POA: Diagnosis not present

## 2016-09-06 DIAGNOSIS — M79605 Pain in left leg: Secondary | ICD-10-CM

## 2016-09-06 DIAGNOSIS — Z79899 Other long term (current) drug therapy: Secondary | ICD-10-CM | POA: Diagnosis not present

## 2016-09-06 DIAGNOSIS — Y939 Activity, unspecified: Secondary | ICD-10-CM | POA: Diagnosis not present

## 2016-09-06 DIAGNOSIS — Y929 Unspecified place or not applicable: Secondary | ICD-10-CM | POA: Diagnosis not present

## 2016-09-06 DIAGNOSIS — Z7982 Long term (current) use of aspirin: Secondary | ICD-10-CM | POA: Insufficient documentation

## 2016-09-06 DIAGNOSIS — T148XXA Other injury of unspecified body region, initial encounter: Secondary | ICD-10-CM

## 2016-09-06 DIAGNOSIS — S8992XA Unspecified injury of left lower leg, initial encounter: Secondary | ICD-10-CM | POA: Diagnosis present

## 2016-09-06 DIAGNOSIS — I10 Essential (primary) hypertension: Secondary | ICD-10-CM | POA: Diagnosis not present

## 2016-09-06 DIAGNOSIS — Y999 Unspecified external cause status: Secondary | ICD-10-CM | POA: Diagnosis not present

## 2016-09-06 DIAGNOSIS — X58XXXA Exposure to other specified factors, initial encounter: Secondary | ICD-10-CM | POA: Insufficient documentation

## 2016-09-06 NOTE — ED Notes (Signed)
Pt c/o left lower leg pain for the last week - she went to PCP today and they felt like she needed to come to the ER for and ultrasound to r/o blood clot

## 2016-09-06 NOTE — ED Triage Notes (Signed)
Pt to ed with c/o left leg pain x 1 week, small knot noted to left lower leg. No redness, only mild swelling noted.

## 2016-09-06 NOTE — ED Provider Notes (Signed)
[facility] Emergency Department Provider Note   ____________________________________________    I have reviewed the triage vital signs and the nursing notes.   HISTORY  Chief Complaint Leg Pain     HPI Vanessa Newman is a 76 y.o. female who presents with complaints of left leg pain. Patient reports she developed a knot on the anterior proximal portion of her lower leg proximal leg 1 week ago. She figured it was muscular in nature and has been trying to "work it out". However her friend told her it may be a blood clot in that it might break off and she might die so she came to the emergency department. No pleurisy. No chest pain. Otherwise feels quite well.   Past Medical History:  Diagnosis Date  . Acid reflux   . Anxiety and depression   . Cystocele    2nd degree  . Fibroids   . Hypercholesteremia   . Hypertension   . Left lower quadrant pain   . Melanoma ([place])   . Rectocele    2nd degree  . SUI (stress urinary incontinence, female)   . Urinary incontinence   . Vaginal atrophy     There are no active problems to display for this patient.   History reviewed. No pertinent surgical history.  Prior to Admission medications   Medication Sig Start Date End Date Taking? Authorizing Provider  aspirin 81 MG tablet Take 81 mg by mouth daily.    Historical Provider, MD  atorvastatin (LIPITOR) 40 MG tablet Take 40 mg by mouth daily.    Historical Provider, MD  ciprofloxacin (CIPRO) 250 MG tablet Take 250 mg by mouth 2 (two) times daily.    Historical Provider, MD  citalopram (CELEXA) 40 MG tablet Take 40 mg by mouth daily.    Historical Provider, MD  estrogens, conjugated, (PREMARIN) 0.625 MG tablet Take 0.625 mg by mouth daily. Take daily for 21 days then do not take for 7 days.    Historical Provider, MD  gabapentin (NEURONTIN) 300 MG capsule Take 300 mg by mouth 3 (three) times daily.    Historical Provider, MD  Multiple Vitamins-Minerals (EYE  VITAMINS & MINERALS PO) Take by mouth.    Historical Provider, MD  omeprazole (PRILOSEC) 20 MG capsule Take 20 mg by mouth daily.    Historical Provider, MD  senna (SENOKOT) 8.6 MG TABS tablet Take 1 tablet by mouth.    Historical Provider, MD  triamterene-hydrochlorothiazide (MAXZIDE-25) 37.5-25 MG per tablet Take 1 tablet by mouth daily.    Historical Provider, MD  Vitamin D, Cholecalciferol, 400 UNITS CHEW Chew 400 Units by mouth daily.    Historical Provider, MD  zinc gluconate 50 MG tablet Take 50 mg by mouth daily.    Historical Provider, MD     Allergies Patient has no known allergies.  Family History  Problem Relation Age of Onset  . Breast cancer Mother 99    Social History Social History  Substance Use Topics  . Smoking status: Never Smoker  . Smokeless tobacco: Never Used  . Alcohol use No    Review of Systems  Constitutional: No fever/chills  Cardiovascular: Denies chest pain. Respiratory: Denies shortness of breath.   Musculoskeletal: As above Skin: Negative for rash. Neurological: Negative for weakness  10-point ROS otherwise negative.  ____________________________________________   PHYSICAL EXAM:  VITAL SIGNS: ED Triage Vitals  Enc Vitals Group     BP 09/06/16 1547 132/80     Pulse Rate 09/06/16  1547 66     Resp 09/06/16 1547 18     Temp 09/06/16 1547 97.8 F (36.6 C)     Temp Source 09/06/16 1547 Oral     SpO2 09/06/16 1547 100 %     Weight 09/06/16 1548 164 lb (74.4 kg)     Height 09/06/16 1548 5\' 3"  (1.6 m)     Head Circumference --      Peak Flow --      Pain Score 09/06/16 1547 3     Pain Loc --      Pain Edu? --      Excl. in [place]? --     Constitutional: Alert and oriented. No acute distress. Pleasant and interactive Eyes: Conjunctivae are normal.  Mouth/Throat: Mucous membranes are moist.    Cardiovascular: Normal rate, regular rhythm.   Good peripheral circulation. Respiratory: Normal respiratory effort.  No retractions.    Genitourinary: deferred Musculoskeletal:Mild tenderness anterorolateral proximal  left lower leg. No calf pain or swelling. No erythema. No bony abnormality. Normal range of motion of both lower extremities. Warm and well perfused Neurologic:  Normal speech and language. No gross focal neurologic deficits are appreciated.  Skin:  Skin is warm, dry and intact. No rash noted. Psychiatric: Mood and affect are normal. Speech and behavior are normal.  ____________________________________________   LABS (all labs ordered are listed, but only abnormal results are displayed)  Labs Reviewed - No data to display ____________________________________________  EKG  None ____________________________________________  RADIOLOGY  Ultrasound negative ____________________________________________   PROCEDURES  Procedure(s) performed: No    Critical Care performed:No ____________________________________________   INITIAL IMPRESSION / ASSESSMENT AND PLAN / ED COURSE  Pertinent labs & imaging results that were available during my care of the patient were reviewed by me and considered in my medical decision making (see chart for details).  Patient's exam is most consistent with muscular injury with mild swelling anterior to the tibia however she was sent by PCP we will obtain ultrasound to evaluate for possible DVT  Ultrasound negative    ____________________________________________   FINAL CLINICAL IMPRESSION(S) / ED DIAGNOSES  Final diagnoses:  Left leg pain  Muscle strain      NEW MEDICATIONS STARTED DURING THIS VISIT:  New Prescriptions   No medications on file     Note:  This document was prepared using Dragon voice recognition software and may include unintentional dictation errors.    Lavonia Drafts, MD 09/06/16 [phone]

## 2017-01-30 ENCOUNTER — Ambulatory Visit (INDEPENDENT_AMBULATORY_CARE_PROVIDER_SITE_OTHER): Payer: Medicare HMO | Admitting: Otolaryngology

## 2017-07-15 ENCOUNTER — Other Ambulatory Visit: Payer: Self-pay | Admitting: Family Medicine

## 2017-07-15 DIAGNOSIS — Z1231 Encounter for screening mammogram for malignant neoplasm of breast: Secondary | ICD-10-CM

## 2017-08-28 ENCOUNTER — Ambulatory Visit
Admission: RE | Admit: 2017-08-28 | Discharge: 2017-08-28 | Disposition: A | Discharge status: Home / Self Care | Payer: Medicare HMO | Source: Ambulatory Visit | Attending: Family Medicine | Admitting: Family Medicine

## 2017-08-28 DIAGNOSIS — Z1231 Encounter for screening mammogram for malignant neoplasm of breast: Secondary | ICD-10-CM | POA: Diagnosis present

## 2017-10-02 ENCOUNTER — Other Ambulatory Visit: Payer: Self-pay | Admitting: Obstetrics and Gynecology

## 2017-10-29 ENCOUNTER — Other Ambulatory Visit (HOSPITAL_COMMUNITY)
Admission: RE | Admit: 2017-10-29 | Discharge: 2017-10-29 | Disposition: A | Discharge status: Home / Self Care | Payer: Medicare HMO | Source: Ambulatory Visit | Attending: Obstetrics and Gynecology | Admitting: Obstetrics and Gynecology

## 2017-10-29 ENCOUNTER — Other Ambulatory Visit: Payer: Self-pay

## 2017-10-29 ENCOUNTER — Encounter: Payer: Self-pay | Admitting: Obstetrics and Gynecology

## 2017-10-29 ENCOUNTER — Encounter (INDEPENDENT_AMBULATORY_CARE_PROVIDER_SITE_OTHER): Payer: Self-pay

## 2017-10-29 ENCOUNTER — Ambulatory Visit: Payer: Medicare HMO | Admitting: Obstetrics and Gynecology

## 2017-10-29 VITALS — BP 124/86 | HR 78 | Ht 66.0 in | Wt 172.0 lb

## 2017-10-29 DIAGNOSIS — Z01419 Encounter for gynecological examination (general) (routine) without abnormal findings: Secondary | ICD-10-CM | POA: Insufficient documentation

## 2017-10-29 DIAGNOSIS — N3946 Mixed incontinence: Secondary | ICD-10-CM

## 2017-10-29 DIAGNOSIS — R1032 Left lower quadrant pain: Secondary | ICD-10-CM | POA: Diagnosis not present

## 2017-10-29 DIAGNOSIS — Z01411 Encounter for gynecological examination (general) (routine) with abnormal findings: Secondary | ICD-10-CM

## 2017-10-29 NOTE — Progress Notes (Signed)
Assessment:  Annual Gyn Exam Urinary Stress Incontinence Plan:  1. pap smear done 2. return annually or prn 3    Annual mammogram advised after age 77 4.   Refill Premarin cream Subjective:  Vanessa Newman is a 77 y.o. female [phone] who presents for annual exam. No LMP recorded. Patient is postmenopausal. The patient has complaints today of associated intermittent LLQ abdominal discomfort, urinary stress incontinence with sitting up/cough/lift/laughing, lump to her left breast, and weight gain of 10 lbs in the past year. She does do Keg[place] exercises. She had a recent mammogram that per patient resulted negative. She notes that her mother passed from breast cancer at age 21. She breast fed all 3 of her children. Pt has not tried any medications for her symptoms. She denies any other symptoms. Her last pap smear was over 20 years ago and her most recent colonoscopy was 3 years ago. She works out at [employer] 3 times a week with water aerobics, dancing, and intermittently lifts weights. Her PCP is Dr. Denton Lank.    The following portions of the patient's history were reviewed and updated as appropriate: allergies, current medications, past family history, past medical history, past social history, past surgical history and problem list. Past Medical History:  Diagnosis Date  . Acid reflux   . Anxiety and depression   . Cystocele    2nd degree  . Fibroids   . Hypercholesteremia   . Hypertension   . Left lower quadrant pain   . Melanoma ([place])   . Rectocele    2nd degree  . SUI (stress urinary incontinence, female)   . Urinary incontinence   . Vaginal atrophy     Past Surgical History:  Procedure Laterality Date  . right knee replacement    . TOTAL HIP ARTHROPLASTY       Current Outpatient Medications:  .  atorvastatin (LIPITOR) 40 MG tablet, Take 40 mg by mouth daily., Disp: , Rfl:  .  calcium carbonate (CALCIUM 600) 600 MG TABS tablet, Take 600 mg by mouth daily with  breakfast., Disp: , Rfl:  .  citalopram (CELEXA) 20 MG tablet, Take 20 mg by mouth daily. , Disp: , Rfl:  .  estrogens, conjugated, (PREMARIN) 0.625 MG tablet, Take 0.625 mg by mouth daily. Take daily for 21 days then do not take for 7 days., Disp: , Rfl:  .  gabapentin (NEURONTIN) 800 MG tablet, Take 800 mg by mouth 3 (three) times daily. , Disp: , Rfl:  .  HYDROcodone-acetaminophen (NORCO/VICODIN) 5-325 MG tablet, Take 1 tablet by mouth every 6 (six) hours as needed for moderate pain., Disp: , Rfl:  .  ibuprofen (ADVIL,MOTRIN) 600 MG tablet, Take 600 mg by mouth every 8 (eight) hours as needed., Disp: , Rfl:  .  Multiple Vitamins-Minerals (EYE VITAMINS & MINERALS PO), Take by mouth., Disp: , Rfl:  .  omeprazole (PRILOSEC) 20 MG capsule, Take 20 mg by mouth daily., Disp: , Rfl:  .  potassium gluconate (HM POTASSIUM) 595 (99 K) MG TABS tablet, Take 595 mg by mouth., Disp: , Rfl:  .  senna (SENOKOT) 8.6 MG TABS tablet, Take 1 tablet by mouth., Disp: , Rfl:  .  triamterene-hydrochlorothiazide (MAXZIDE-25) 37.5-25 MG per tablet, Take 1 tablet by mouth daily., Disp: , Rfl:  .  Vitamin D, Cholecalciferol, 400 UNITS CHEW, Chew 400 Units by mouth daily., Disp: , Rfl:  .  aspirin 81 MG tablet, Take 81 mg by mouth daily., Disp: , Rfl:  Review of Systems Constitutional: negative Gastrointestinal: negative Genitourinary: urinary stress incontinence    Objective:  BP 124/86 (BP Location: Right Arm, Patient Position: Sitting, Cuff Size: Normal)   Pulse 78   Ht 5\' 6"  (1.676 m)   Wt 172 lb (78 kg)   BMI 27.76 kg/m    BMI: Body mass index is 27.76 kg/m.  General Appearance: Alert, appropriate appearance for age. No acute distress HEENT: Grossly normal Neck / Thyroid:  Cardiovascular: RRR; normal S1, S2, no murmur Lungs: CTA bilaterally Back: No CVAT Breast Exam: Irregular fibrocystic breast changes, 1 cm small mobile area to left breast with recent negative 3D mammogram. No nodes. No dimpling,  nipple retraction or discharge. Gastrointestinal: Soft, non-tender, no masses or organomegaly. No hernias noted. Pelvic Exam: External genitalia: normal general appearance Vaginal: normal mucosa without prolapse or lesions, normal without tenderness, induration or masses and normal rugae Cervix: normal appearance Adnexa: normal bimanual exam Uterus: normal single, nontender Rectal: good sphincter tone, no masses and guaiac negative Rectovaginal: not indicated Lymphatic Exam: Non-palpable nodes in neck, clavicular, axillary, or inguinal regions Skin: no rash or abnormalities Neurologic: Normal gait and speech, no tremor  Psychiatric: Alert and oriented, appropriate affect.  Urinalysis:Not done  Mallory Shirk. MD Pgr [phone] 1:56 PM   By signing my name below, I, Soijett Blue, attest that this documentation has been prepared under the direction and in the presence of Jonnie Kind, MD. Electronically Signed: Soijett Blue, Medical Scribe. 10/29/17. 1:56 PM.  I personally performed the services described in this documentation, which was SCRIBED in my presence. The recorded information has been reviewed and considered accurate. It has been edited as necessary during review. Jonnie Kind, MD

## 2017-10-31 LAB — CYTOLOGY - PAP
Diagnosis: NEGATIVE
HPV: NOT DETECTED

## 2017-11-10 ENCOUNTER — Other Ambulatory Visit: Payer: Self-pay | Admitting: Family Medicine

## 2017-11-10 DIAGNOSIS — Z1231 Encounter for screening mammogram for malignant neoplasm of breast: Secondary | ICD-10-CM

## 2017-11-19 ENCOUNTER — Ambulatory Visit: Payer: Medicare HMO

## 2017-11-21 ENCOUNTER — Telehealth: Payer: Self-pay | Admitting: Adult Health

## 2017-11-21 ENCOUNTER — Other Ambulatory Visit: Payer: Self-pay | Admitting: Family Medicine

## 2017-11-21 DIAGNOSIS — N6321 Unspecified lump in the left breast, upper outer quadrant: Secondary | ICD-10-CM

## 2017-11-21 NOTE — Telephone Encounter (Signed)
Patient called stating that she would like a call back from [place]. Patient did not state the reason why. [facility] contact pt

## 2017-11-21 NOTE — Telephone Encounter (Signed)
Pt says she was seen 5/22 and needed referral and she is having one sent over.

## 2017-11-26 ENCOUNTER — Ambulatory Visit
Admission: RE | Admit: 2017-11-26 | Discharge: 2017-11-26 | Disposition: A | Discharge status: Home / Self Care | Payer: Medicare HMO | Source: Ambulatory Visit | Attending: Family Medicine | Admitting: Family Medicine

## 2017-11-26 DIAGNOSIS — N6321 Unspecified lump in the left breast, upper outer quadrant: Secondary | ICD-10-CM | POA: Insufficient documentation

## 2018-02-11 ENCOUNTER — Other Ambulatory Visit: Payer: Self-pay | Admitting: Neurology

## 2018-02-11 DIAGNOSIS — R2689 Other abnormalities of gait and mobility: Secondary | ICD-10-CM

## 2018-02-14 ENCOUNTER — Ambulatory Visit: Payer: Medicare HMO

## 2018-03-11 ENCOUNTER — Ambulatory Visit: Payer: Medicare HMO | Admitting: Physical Therapy

## 2018-03-19 ENCOUNTER — Ambulatory Visit: Payer: Medicare HMO | Admitting: Physical Therapy

## 2018-03-24 ENCOUNTER — Ambulatory Visit: Payer: Medicare HMO | Attending: Neurology | Admitting: Physical Therapy

## 2018-03-24 ENCOUNTER — Other Ambulatory Visit: Payer: Self-pay

## 2018-03-24 DIAGNOSIS — R2689 Other abnormalities of gait and mobility: Secondary | ICD-10-CM | POA: Diagnosis present

## 2018-03-24 DIAGNOSIS — R2681 Unsteadiness on feet: Secondary | ICD-10-CM | POA: Insufficient documentation

## 2018-03-24 NOTE — Patient Instructions (Signed)
Access Code: [email or web address]  URL: [email or web address]  Date: 03/24/2018  Prepared by: Collie Siad   Exercises  Standing Tandem Balance with Unilateral Counter Support - 2 reps - 1 sets - 30 hold - 3x daily - 7x weekly  Standing Single Leg Stance with Counter Support - 2 reps - 1 sets - 30 hold - 3x daily - 7x weekly

## 2018-03-25 ENCOUNTER — Encounter: Payer: Self-pay | Admitting: Physical Therapy

## 2018-03-25 NOTE — Therapy (Signed)
[place] MAIN [facility] SERVICES [address] [place], [place], [address] Phone: [phone]   Fax:  [phone]  Physical Therapy Evaluation  Patient Details  Name: Vanessa Newman MRN: [id number] Date of Birth: 12/27/1940 Referring Provider (PT): Dr. Jennings Books   Encounter Date: 03/24/2018  PT End of Session - 03/25/18 0927    Visit Number  1    Number of Visits  13    Date for PT Re-Evaluation  05/05/18    Authorization Type  1/10 progress note    Authorization Time Period  start of reporting period 10/15    PT Start Time  1516    PT Stop Time  1606    PT Time Calculation (min)  50 min    Equipment Utilized During Treatment  Gait belt    Activity Tolerance  Patient tolerated treatment well    Behavior During Therapy  [facility] for tasks assessed/performed       Past Medical History:  Diagnosis Date  . Acid reflux   . Anxiety and depression   . Cystocele    2nd degree  . Fibroids   . Hypercholesteremia   . Hypertension   . Left lower quadrant pain   . Melanoma ([place])   . Rectocele    2nd degree  . SUI (stress urinary incontinence, female)   . Urinary incontinence   . Vaginal atrophy     Past Surgical History:  Procedure Laterality Date  . right knee replacement    . TOTAL HIP ARTHROPLASTY      There were no vitals filed for this visit.   Subjective Assessment - 03/24/18 1538    Subjective  I want to improve my balance so I don't have to walk like a duck    Pertinent History  Pt is a 77 y/o F who reports feeling imbalanced over the past year and feels as though she walks "like a duck" due to imbalance.  Pt denies any fall history.  Pt does not use AD to ambulate.  Pt feels that she sometimes drags her RLE.  Pt does [place] for the past 6 months and says this has helped with her balance.  Water aerobics 3 days/wk, dancing 1 day/wk.  Pt reports chronic pain distally from Bil knees for 2-3 yrs, h/o neuropathy.  Current and  best pain: 2/10.  Worst pain: 6/10.  Pt ind with ADLs and IADLs.  Pt's goals: to improve her balance and steadiness while walking.  Pt says she has Bil carpal tunnel which her MD is aware of and has medication for this.  Current pain: 1/10.      How long can you sit comfortably?  no issue    How long can you stand comfortably?  no issue    How long can you walk comfortably?  10 minutes    Patient Stated Goals  see above    Currently in Pain?  Yes    Pain Score  2     Pain Location  Leg    Pain Orientation  Left;Right    Pain Descriptors / Indicators  Discomfort    Pain Type  Chronic pain    Pain Onset  More than a month ago    Pain Frequency  Constant    Multiple Pain Sites  Yes    Pain Score  1    Pain Location  Arm    Pain Orientation  Left;Right    Pain Descriptors /  Indicators  Discomfort    Pain Type  Chronic pain    Pain Onset  More than a month ago    Pain Frequency  Constant         [facility] PT Assessment - 03/24/18 1530      Assessment   Medical Diagnosis  Poor Balance    Referring Provider (PT)  Dr. Jennings Books    Onset Date/Surgical Date  03/24/17    Hand Dominance  Right    Next MD Visit  03/31/18 to see PCP    Prior Therapy  Yes, she believes for therapy following THA or TKA ~8 years ago      Precautions   Precautions  None      Restrictions   Weight Bearing Restrictions  No      Balance Screen   Has the patient fallen in the past 6 months  No    Has the patient had a decrease in activity level because of a fear of falling?   No    Is the patient reluctant to leave their home because of a fear of falling?   No      Home Environment   Living Environment  Private residence    Living Arrangements  Alone    Type of [place]  One level    [place] - 2 wheels;Wheelchair - manual;Cane - single point;Tub bench;Grab bars - tub/shower   raised toilet seat     Prior Function   Level of Independence   Independent    Vocation  Retired    Leisure  dancing, exercising      Cognition   Overall Cognitive Status  Within Functional Limits for tasks assessed      ROM / Strength   AROM / PROM / Strength  --      Strength   Overall Strength  --    Strength Assessment Site  --    Right/Left Hip  --      Balance   Balance Assessed  Yes      Standardized Balance Assessment   Standardized Balance Assessment  Berg Balance Test;Dynamic Gait Index      Berg Balance Test   Sit to Stand  Able to stand without using hands and stabilize independently    Standing Unsupported  Able to stand safely 2 minutes    Sitting with Back Unsupported but Feet Supported on Floor or Stool  Able to sit safely and securely 2 minutes    Stand to Sit  Sits safely with minimal use of hands    Transfers  Able to transfer safely, minor use of hands    Standing Unsupported with Eyes Closed  Able to stand 10 seconds safely    Standing Ubsupported with Feet Together  Able to place feet together independently and stand 1 minute safely    From Standing, Reach Forward with Outstretched Arm  Can reach forward >5 cm safely (2")    From Standing Position, Pick up Object from Floor  Able to pick up shoe safely and easily    From Standing Position, Turn to Look Behind Over each Shoulder  Turn sideways only but maintains balance    Turn 360 Degrees  Able to turn 360 degrees safely in 4 seconds or less    Standing Unsupported, Alternately Place Feet on Step/Stool  Able to stand independently and safely and complete 8  steps in 20 seconds    Standing Unsupported, One Foot in [employer] balance while stepping or standing    Standing on One Leg  Unable to try or needs assist to prevent fall    Total Score  44      Dynamic Gait Index   Level Surface  Mild Impairment    Change in Gait Speed  Normal    Gait with Horizontal Head Turns  Moderate Impairment    Gait with Vertical Head Turns  Mild Impairment    Gait and Pivot Turn   Mild Impairment    Step Over Obstacle  Mild Impairment    Step Around Obstacles  Normal    Steps  Mild Impairment    Total Score  17        EXAMINATION   5xSTS: 12.57 seconds   Berg Balance Test: 44/56   DGI: 17/24   50mWT (fast pace): 1.2 m/s   Tandem stance: 4 seconds with R foot in front, 5 seconds with L foot in front   SLS: 3 seconds each LE   Sensation: Dec sensation to light touch distal to mid shin BLE neuropathy   Gait assessment: wide BOS, Bil hip ER (R>L), dec DF Bil, mild trunk lean to the R, dec trunk rotation Bil   BLE strength assessment: WNL BLE with MMT         TREATMENT   SLS x30 seconds each LE (added to HEP)   Tandem stance x30 seconds alternating each foot in front (added to HEP)         Objective measurements completed on examination: See above findings.              PT Education - 03/25/18 7672    Education Details  HEP and handout; POC; role of PT    Person(s) Educated  Patient    Methods  Explanation;Demonstration;Verbal cues;Handout    Comprehension  Verbalized understanding;Returned demonstration;Verbal cues required;Need further instruction       PT Short Term Goals - 03/25/18 0935      PT SHORT TERM GOAL #1   Title  Pt will complete HEP at least 4 days/wk for improved carryover between sessions    Time  2    Period  Weeks    Status  New        PT Long Term Goals - 03/25/18 0935      PT LONG TERM GOAL #1   Title  Pt's DGI will improve to 24/24 to demonstrate decreased fall risk    Baseline  17/24    Time  5    Period  Weeks    Status  New      PT LONG TERM GOAL #2   Title  Pt's Berg Balance Score will improve to at least 52/56 to demonstrate decreased fall risk    Baseline  44/56    Time  6    Period  Weeks    Status  New      PT LONG TERM GOAL #3   Title  Pt's SLS time will improve to at least 10 seconds each LE for improved balance    Baseline  3 sec each LE    Time  4    Period  Weeks     Status  New      PT LONG TERM GOAL #4   Title  Pt's tandem stance time will improve to at least 10 seconds with each LE in the  rear to demonstrate improved balance    Baseline  4 seconds with R foot in front, 5 seconds with L foot in front    Time  4    Period  Weeks    Status  New             Plan - 03/24/18 1608    Clinical Impression Statement  Pt is a 77 y/o F who presents with gradually worsening imbalance that began ~1 year ago.  She denies any fall history but ambualtes with a wide BOS with Bil hips ER likely to compensate for imbalance.  Pt's BLE strength is WNL with MMT.  However, she scored a 44/56 on the Berg and a 17/24 on the DGI, indicating the pt has an increased fall risk.  Pt's SLS time and tandem stance time on each LE is a concern, indicating imbalance.  Pt will benefit from skilled PT interventions for improved balance and decreased fall risk.      History and Personal Factors relevant to plan of care:  (+) Pt lives a very active lifestyle, participates in [place] for her balance  (-) h/o THA, TKA and rod in R thigh.  h/o Peripheral neuropathy    Clinical Presentation  Stable    Clinical Presentation due to:  Pt's presentation of poor balance has been gradual over the past year with dx of peripheral neuropathy    Clinical Decision Making  Low    Rehab Potential  Good    PT Frequency  2x / week    PT Duration  6 weeks    PT Treatment/Interventions  ADLs/Self Care Home Management;Aquatic Therapy;DME Instruction;Gait training;Stair training;Functional mobility training;Therapeutic activities;Therapeutic exercise;Balance training;Neuromuscular re-education;Patient/family education;Manual techniques;Energy conservation;Taping    PT Next Visit Plan  advance HEP as appropriate, have pt fill out ABC scale    PT Home Exercise Plan  SLS, tandem stance    Recommended Other Services  none at this time    Consulted and Agree with Plan of Care  Patient       Patient will  benefit from skilled therapeutic intervention in order to improve the following deficits and impairments:  Abnormal gait, Decreased balance, Decreased endurance, Decreased safety awareness, Impaired perceived functional ability, Pain, Improper body mechanics, Postural dysfunction, Impaired sensation  Visit Diagnosis: Unsteadiness on feet  Other abnormalities of gait and mobility     Problem List There are no active problems to display for this patient.   Collie Siad PT, DPT 03/25/2018, 9:39 AM  [place] MAIN [facility] SERVICES [address] [place], [place], [address] Phone: [phone]   Fax:  [phone]  Name: Vanessa Newman MRN: [id number] Date of Birth: March 19, 1941

## 2018-03-26 ENCOUNTER — Ambulatory Visit: Payer: Medicare HMO

## 2018-03-30 ENCOUNTER — Ambulatory Visit: Payer: Medicare HMO | Admitting: Physical Therapy

## 2018-03-30 ENCOUNTER — Encounter: Payer: Self-pay | Admitting: Physical Therapy

## 2018-03-30 DIAGNOSIS — R2681 Unsteadiness on feet: Secondary | ICD-10-CM | POA: Diagnosis not present

## 2018-03-30 DIAGNOSIS — R2689 Other abnormalities of gait and mobility: Secondary | ICD-10-CM

## 2018-03-30 NOTE — Therapy (Addendum)
[place] MAIN [facility] SERVICES [address] [place], [place], [address] Phone: [phone]   Fax:  [phone]  Physical Therapy Treatment  Patient Details  Name: Vanessa Newman MRN: [id number] Date of Birth: [birth year] Referring Provider (PT): Dr. Jennings Books   Encounter Date: 03/30/2018  PT End of Session - 03/30/18 1350    Visit Number  2    Number of Visits  13    Date for PT Re-Evaluation  05/05/18    Authorization Type  2/10 progress note    Authorization Time Period  start of reporting period 10/15    PT Start Time  1350    PT Stop Time  1430    PT Time Calculation (min)  40 min    Equipment Utilized During Treatment  Gait belt    Activity Tolerance  Patient tolerated treatment well    Behavior During Therapy  [facility] for tasks assessed/performed       Past Medical History:  Diagnosis Date  . Acid reflux   . Anxiety and depression   . Cystocele    2nd degree  . Fibroids   . Hypercholesteremia   . Hypertension   . Left lower quadrant pain   . Melanoma ([place])   . Rectocele    2nd degree  . SUI (stress urinary incontinence, female)   . Urinary incontinence   . Vaginal atrophy     Past Surgical History:  Procedure Laterality Date  . right knee replacement    . TOTAL HIP ARTHROPLASTY      There were no vitals filed for this visit.  Subjective Assessment - 03/30/18 1355    Subjective  Patient reports she is doing well today; went to the gym and did water aerobics this morning. Pt does have some pain in BLE due to neuropathy.     Pertinent History  Pt is a 77 y/o F who reports feeling imbalanced over the past year and feels as though she walks "like a duck" due to imbalance.  Pt denies any fall history.  Pt does not use AD to ambulate.  Pt feels that she sometimes drags her RLE.  Pt does [place] for the past 6 months and says this has helped with her balance.  Water aerobics 3 days/wk, dancing 1 day/wk.  Pt reports  chronic pain distally from Bil knees for 2-3 yrs, h/o neuropathy.  Current and best pain: 2/10.  Worst pain: 6/10.  Pt ind with ADLs and IADLs.  Pt's goals: to improve her balance and steadiness while walking.  Pt says she has Bil carpal tunnel which her MD is aware of and has medication for this.  Current pain: 1/10.      How long can you sit comfortably?  no issue    How long can you stand comfortably?  no issue    How long can you walk comfortably?  10 minutes    Patient Stated Goals  see above    Currently in Pain?  Yes    Pain Score  6     Pain Location  Leg    Pain Orientation  Right;Left    Pain Descriptors / Indicators  Burning;Tingling;Pins and needles    Pain Type  Chronic pain    Pain Onset  More than a month ago    Pain Frequency  Constant    Aggravating Factors   going up stairs    Pain Relieving Factors  swimming in water, gabapentin  Effect of Pain on Daily Activities  none    Multiple Pain Sites  No          Treatment Warm up on Octane cross trainer level 1 x4 min while taking history and updating on symptoms;  Patient instructed in advanced balance exercise  Standing in parallel bars: Standing on airex foam: -alternate toe taps to 4 inch step with no rail assist x15 reps bilaterally; -Standing one foot on airex, one foot on 4 inch step, BUE ball pass side/side x5 reps each foot on step -Heel/toe raises x15 reps without UE support -mini squat unsupported x10 reps with cues for reaching forward for better stance control; -Alternate march with 2-1 rail assist with min Vcs to increase hip flexion for better strength and balance challenge x10 reps bilaterally -modified tandem stance: balloon passes x1 min with each foot in rear Patient required CGA-min A and min VCs for balance stability, including to increase trunk control for less loss of balance with smaller base of support  Standing on 1/2 foam: (Flat side up) -heel/toe rocks with feet apart heel/toe rocks x15  with rail assist for safety -tandem stance with 2-0 rail assist 10 sec hold x2 reps each foot in front with CGA to min A for safety and cues to improve erect posture and increase weight shift for better stance control  Instructed patient in dynamic balance exercise: Resisted walking, 12.5# forward/backward, side/side x4 way, x2 laps each direction; required min A for safety and cues to improve weight shift especially with eccentric control for better balance control.                      PT Education - 03/30/18 1349    Education Details  exercise technique/form, balance    Person(s) Educated  Patient    Methods  Explanation;Demonstration;Verbal cues    Comprehension  Returned demonstration;Verbalized understanding;Verbal cues required;Need further instruction       PT Short Term Goals - 03/25/18 0935      PT SHORT TERM GOAL #1   Title  Pt will complete HEP at least 4 days/wk for improved carryover between sessions    Time  2    Period  Weeks    Status  New        PT Long Term Goals - 03/25/18 0935      PT LONG TERM GOAL #1   Title  Pt's DGI will improve to 24/24 to demonstrate decreased fall risk    Baseline  17/24    Time  5    Period  Weeks    Status  New      PT LONG TERM GOAL #2   Title  Pt's Berg Balance Score will improve to at least 52/56 to demonstrate decreased fall risk    Baseline  44/56    Time  6    Period  Weeks    Status  New      PT LONG TERM GOAL #3   Title  Pt's SLS time will improve to at least 10 seconds each LE for improved balance    Baseline  3 sec each LE    Time  4    Period  Weeks    Status  New      PT LONG TERM GOAL #4   Title  Pt's tandem stance time will improve to at least 10 seconds with each LE in the rear to demonstrate improved balance    Baseline  4 seconds with R foot in front, 5 seconds with L foot in front    Time  4    Period  Weeks    Status  New            Plan - 03/30/18 1524    Clinical  Impression Statement  Patient tolerated therapy session well. Instructed pt in advanced balance exercises. Pt required CGA-min A for all postural control activities with min A only required to assist in correcting LOB. Pt requires VCs and demonstration for proper technique and sequencing. Pt demonstrated greatest challenge with tandem stance on 1/2 foam roll with greater challenge with LLE in rear; required VCs for engaging trunk and gluts to maintain balance. Patient verbalized difficulty with resisted walking especially with side stepping to the R and having to control eccentric return to the L; required min A for safety and cues to improve weight shift with eccentric return. Pt will continue to benefit from skilled PT intervention for improvements in balance and gait safety.     Rehab Potential  Good    PT Frequency  2x / week    PT Duration  6 weeks    PT Treatment/Interventions  ADLs/Self Care Home Management;Aquatic Therapy;DME Instruction;Gait training;Stair training;Functional mobility training;Therapeutic activities;Therapeutic exercise;Balance training;Neuromuscular re-education;Patient/family education;Manual techniques;Energy conservation;Taping    PT Next Visit Plan  advance HEP as appropriate, have pt fill out ABC scale    PT Home Exercise Plan  SLS, tandem stance, mini squats, heel raises, toe raises    Consulted and Agree with Plan of Care  Patient       Patient will benefit from skilled therapeutic intervention in order to improve the following deficits and impairments:  Abnormal gait, Decreased balance, Decreased endurance, Decreased safety awareness, Impaired perceived functional ability, Pain, Improper body mechanics, Postural dysfunction, Impaired sensation  Visit Diagnosis: Unsteadiness on feet  Other abnormalities of gait and mobility     Problem List There are no active problems to display for this patient.  Harriet Masson, SPT This entire session was performed under  direct supervision and direction of a licensed therapist/therapist assistant . I have personally read, edited and approve of the note as written.  Trotter,Margaret PT, DPT 03/31/2018, 1:39 PM  [place] MAIN [facility] SERVICES [address] [place], [place], [address] Phone: [phone]   Fax:  [phone]  Name: GENNETTE SHADIX MRN: [id number] Date of Birth: [birth year]

## 2018-03-30 NOTE — Patient Instructions (Signed)
Access Code: [email or web address]  URL: [email or web address]  Date: 03/30/2018  Prepared by: Blanche East   Exercises  Mini Squat with Chair - 15 reps - 2 sets - 2x daily - 7x weekly  Standing Heel Raises - 15 reps - 2 sets - 2x daily - 7x weekly  Toe Raises with Counter Support - 15 reps - 2 sets - 2x daily - 7x weekly

## 2018-04-02 ENCOUNTER — Ambulatory Visit: Payer: Medicare HMO

## 2018-04-07 ENCOUNTER — Ambulatory Visit: Payer: Medicare HMO | Admitting: Physical Therapy

## 2018-04-07 ENCOUNTER — Encounter: Payer: Self-pay | Admitting: Physical Therapy

## 2018-04-07 DIAGNOSIS — R2681 Unsteadiness on feet: Secondary | ICD-10-CM | POA: Diagnosis not present

## 2018-04-07 DIAGNOSIS — R2689 Other abnormalities of gait and mobility: Secondary | ICD-10-CM

## 2018-04-07 NOTE — Therapy (Addendum)
[place] MAIN [facility] SERVICES [address] [place], [place], [address] Phone: [phone]   Fax:  [phone]  Physical Therapy Treatment  Vanessa Newman Details  Name: Vanessa Newman MRN: [id number] Date of Birth: [birth year] Referring Provider (Vanessa Newman): Dr. Jennings Books   Encounter Date: 04/07/2018  Vanessa Newman End of Session - 04/07/18 1358    Visit Number  3    Number of Visits  13    Date for Vanessa Newman Re-Evaluation  05/05/18    Authorization Type  3/10 progress note    Authorization Time Period  start of reporting period 10/15    Vanessa Newman Start Time  1350    Vanessa Newman Stop Time  1430    Vanessa Newman Time Calculation (min)  40 min    Equipment Utilized During Treatment  Gait belt    Activity Tolerance  Vanessa Newman tolerated treatment well    Behavior During Therapy  [facility] for tasks assessed/performed       Past Medical History:  Diagnosis Date  . Acid reflux   . Anxiety and depression   . Cystocele    2nd degree  . Fibroids   . Hypercholesteremia   . Hypertension   . Left lower quadrant pain   . Melanoma ([place])   . Rectocele    2nd degree  . SUI (stress urinary incontinence, female)   . Urinary incontinence   . Vaginal atrophy     Past Surgical History:  Procedure Laterality Date  . right knee replacement    . TOTAL HIP ARTHROPLASTY      There were no vitals filed for this visit.  Subjective Assessment - 04/07/18 1355    Subjective  Vanessa Newman reports she is doing well; has noticed a little improvement in balance since beginning therapy. Vanessa Newman went to line dancing class this morning and is having pain in BLE. She says she has been doing all her exercises every day.     Pertinent History  Vanessa Newman is a 77 y/o F who reports feeling imbalanced over the past year and feels as though she walks "like a duck" due to imbalance.  Vanessa Newman denies any fall history.  Vanessa Newman does not use AD to ambulate.  Vanessa Newman feels that she sometimes drags her RLE.  Vanessa Newman does [place] for the past 6 months and says this has  helped with her balance.  Water aerobics 3 days/wk, dancing 1 day/wk.  Vanessa Newman reports chronic pain distally from Bil knees for 2-3 yrs, h/o neuropathy.  Current and best pain: 2/10.  Worst pain: 6/10.  Vanessa Newman ind with ADLs and IADLs.  Vanessa Newman's goals: to improve her balance and steadiness while walking.  Vanessa Newman says she has Bil carpal tunnel which her MD is aware of and has medication for this.  Current pain: 1/10.      How long can you sit comfortably?  no issue    How long can you stand comfortably?  no issue    How long can you walk comfortably?  10 minutes    Vanessa Newman Stated Goals  see above    Currently in Pain?  Yes    Pain Score  8     Pain Location  Leg    Pain Orientation  Right;Left    Pain Descriptors / Indicators  Burning;Dull;Tingling;Aching    Pain Type  Chronic pain    Pain Onset  More than a month ago    Pain Frequency  Constant    Aggravating Factors   going up  stairs    Pain Relieving Factors  swimming in pool, gabapentin    Effect of Pain on Daily Activities  none    Multiple Pain Sites  No         Treatment  Warm up on Octane cross trainer level 2 x4 min; VCs to maintain speed above 50 for increased cardiovascular challenge  Vanessa Newman instructed in advanced balance exercise  Standing in parallel bars: Standing on airex foam: -alternate toe taps to 4 inch step with no rail assist x15 reps bilaterally; -Standing one foot on airex, one foot on 4 inch step, BUE ball pass side/side x5 reps each foot on step -Heel/toe raises x15 reps without UE support -mini squat unsupported x10 reps with cues for reaching forward for better stance control; -Alternate march with no rail assist with min Vcs to increase hip flexion for better strength and balance challenge x10 reps bilaterally -Tandem stance x30 sec holds x2 reps each side without UE support  -modified tandem stance: balloon passes x1 min with each foot in rear Vanessa Newman required CGA-min A and min VCs for balance stability, including to  increase trunk control for less loss of balance with smaller base of support  Forward stepping over #2 hurdles without UE support, VCs for taking large enough steps to clear the hurdle and bringing the knee all the way up to clear the hurdle  Instructed Vanessa Newman in dynamic balance exercise: Resisted walking, 12.5# forward/backward, side/side x4 way, x2 laps each direction; required min A for safety and cues to improve weight shift especially with eccentric control for better balance control.  Stair climbing x3 ascends/descends, first trial with railing, alternating feet, second trial without railing and 2 feet to a stairs, and third trial without railing and alternating feet  Step ups x5 reps with VCs for sequencing without UE support                     Vanessa Newman Education - 04/07/18 1357    Education Details  exercise technique/form, balance    Person(s) Educated  Vanessa Newman    Methods  Explanation;Demonstration;Verbal cues    Comprehension  Verbalized understanding;Returned demonstration;Verbal cues required;Need further instruction       Vanessa Newman Short Term Goals - 03/25/18 0935      Vanessa Newman SHORT TERM GOAL #1   Title  Vanessa Newman will complete HEP at least 4 days/wk for improved carryover between sessions    Time  2    Period  Weeks    Status  New        Vanessa Newman Long Term Goals - 03/25/18 0935      Vanessa Newman LONG TERM GOAL #1   Title  Vanessa Newman's DGI will improve to 24/24 to demonstrate decreased fall risk    Baseline  17/24    Time  5    Period  Weeks    Status  New      Vanessa Newman LONG TERM GOAL #2   Title  Vanessa Newman's Berg Balance Score will improve to at least 52/56 to demonstrate decreased fall risk    Baseline  44/56    Time  6    Period  Weeks    Status  New      Vanessa Newman LONG TERM GOAL #3   Title  Vanessa Newman's SLS time will improve to at least 10 seconds each LE for improved balance    Baseline  3 sec each LE    Time  4    Period  Weeks  Status  New      Vanessa Newman LONG TERM GOAL #4   Title  Vanessa Newman's tandem stance time  will improve to at least 10 seconds with each LE in the rear to demonstrate improved balance    Baseline  4 seconds with R foot in front, 5 seconds with L foot in front    Time  4    Period  Weeks    Status  New            Plan - 04/07/18 1438    Clinical Impression Statement  Vanessa Newman tolerated therapy session well. Vanessa Newman continues to require CGA-close supervision for all balance activities for safety; VCs and demonstration for proper technique and sequencing. Vanessa Newman demonstrated greatest challenge with stepping over 2 hurdles back to back but improved with repetition. Vanessa Newman motivated to attempt resisted walking to improved with eccentric return. Vanessa Newman demonstrated improved ability to perform stairs with decreased UE support; VCs for sequencing and proper technique. Vanessa Newman will continue to benefit from skilled Vanessa Newman intervention for improvements in balance and gait safety.    Rehab Potential  Good    Vanessa Newman Frequency  2x / week    Vanessa Newman Duration  6 weeks    Vanessa Newman Treatment/Interventions  ADLs/Self Care Home Management;Aquatic Therapy;DME Instruction;Gait training;Stair training;Functional mobility training;Therapeutic activities;Therapeutic exercise;Balance training;Neuromuscular re-education;Vanessa Newman/family education;Manual techniques;Energy conservation;Taping    Vanessa Newman Next Visit Plan  advance HEP as appropriate, have Vanessa Newman fill out ABC scale    Vanessa Newman Home Exercise Plan  SLS, tandem stance, mini squats, heel raises, toe raises    Consulted and Agree with Plan of Care  Vanessa Newman       Vanessa Newman will benefit from skilled therapeutic intervention in order to improve the following deficits and impairments:  Abnormal gait, Decreased balance, Decreased endurance, Decreased safety awareness, Impaired perceived functional ability, Pain, Improper body mechanics, Postural dysfunction, Impaired sensation  Visit Diagnosis: Unsteadiness on feet  Other abnormalities of gait and mobility     Problem List There are no active problems to  display for this Vanessa Newman.  Harriet Masson, SPT This entire session was performed under direct supervision and direction of a licensed therapist/therapist assistant . I have personally read, edited and approve of the note as written.  Trotter,Margaret Vanessa Newman, DPT 04/08/2018, 9:16 AM  [place] [employer] MAIN [facility] SERVICES [address] [place], [place], [address] Phone: [phone]   Fax:  [phone]  Name: CLAUDETTE WERMUTH MRN: [id number] Date of Birth: 06/29/1940

## 2018-04-09 ENCOUNTER — Ambulatory Visit: Payer: Medicare HMO | Admitting: Physical Therapy

## 2018-04-09 ENCOUNTER — Encounter: Payer: Self-pay | Admitting: Physical Therapy

## 2018-04-09 DIAGNOSIS — R2689 Other abnormalities of gait and mobility: Secondary | ICD-10-CM

## 2018-04-09 DIAGNOSIS — R2681 Unsteadiness on feet: Secondary | ICD-10-CM

## 2018-04-09 NOTE — Therapy (Addendum)
[place] MAIN [facility] SERVICES [address] [place], [place], [address] Phone: [phone]   Fax:  [phone]  Physical Therapy Treatment  Patient Details  Name: Vanessa Newman MRN: [id number] Date of Birth: 22-May-1941 Referring Provider (PT): Dr. Jennings Books   Encounter Date: 04/09/2018  PT End of Session - 04/09/18 1300    Visit Number  4    Number of Visits  13    Date for PT Re-Evaluation  05/05/18    Authorization Type  4/10 progress note    Authorization Time Period  start of reporting period 10/15    PT Start Time  1300    PT Stop Time  1345    PT Time Calculation (min)  45 min    Equipment Utilized During Treatment  Gait belt    Activity Tolerance  Patient tolerated treatment well    Behavior During Therapy  [facility] for tasks assessed/performed       Past Medical History:  Diagnosis Date  . Acid reflux   . Anxiety and depression   . Cystocele    2nd degree  . Fibroids   . Hypercholesteremia   . Hypertension   . Left lower quadrant pain   . Melanoma ([place])   . Rectocele    2nd degree  . SUI (stress urinary incontinence, female)   . Urinary incontinence   . Vaginal atrophy     Past Surgical History:  Procedure Laterality Date  . right knee replacement    . TOTAL HIP ARTHROPLASTY      There were no vitals filed for this visit.  Subjective Assessment - 04/09/18 1306    Subjective  Patient reports she is doing well today; no complaints of pain today but some numbness in bottom of feet. Pt says she does her HEP every day and has no current questions.     Pertinent History  Pt is a 77 y/o F who reports feeling imbalanced over the past year and feels as though she walks "like a duck" due to imbalance.  Pt denies any fall history.  Pt does not use AD to ambulate.  Pt feels that she sometimes drags her RLE.  Pt does [place] for the past 6 months and says this has helped with her balance.  Water aerobics 3 days/wk, dancing  1 day/wk.  Pt reports chronic pain distally from Bil knees for 2-3 yrs, h/o neuropathy.  Current and best pain: 2/10.  Worst pain: 6/10.  Pt ind with ADLs and IADLs.  Pt's goals: to improve her balance and steadiness while walking.  Pt says she has Bil carpal tunnel which her MD is aware of and has medication for this.  Current pain: 1/10.      How long can you sit comfortably?  no issue    How long can you stand comfortably?  no issue    How long can you walk comfortably?  10 minutes    Patient Stated Goals  see above    Currently in Pain?  No/denies        Treatment Warm up on treadmill 2.0 mph x5 min with 2 HHA; monitored HR throughout; 107 at end of 5 min with no SOB or difficulty talking  Backwards walking:  -x20 ft x2 without AD with VCs to take longer steps -x20 ft with side/side head turns -x20 ft with up/down head turns  Squats standing on airex pad x15 reps without UE support  Single leg  squats with faded UE support x10 reps total; VCs for maintaining upright posture  Semi-tandem stance with each foot on dyna-disk x15 sec holds without UE support with each foot in rear; CGA-min A for safety with VCs for utilizing ankles to maintain balance  Airex balance beam:  -Tandem walking x3 laps with faded UE support until no UE support -Side stepping x3 laps without UE support -Balloon passes with lateral standing x2 min bout x2 bouts with second bout narrow BOS  Resisted walking, 12.5# forward/backward, side/side x4 way, x2 laps each direction; required CGA for safety and cues to improve weight shift especially with eccentric control for better balance control.       PT Education - 04/09/18 1300    Education Details  exercise technique/form, balance    Person(s) Educated  Patient    Methods  Explanation;Demonstration;Verbal cues    Comprehension  Verbalized understanding;Returned demonstration;Verbal cues required;Need further instruction       PT Short Term Goals -  03/25/18 0935      PT SHORT TERM GOAL #1   Title  Pt will complete HEP at least 4 days/wk for improved carryover between sessions    Time  2    Period  Weeks    Status  New        PT Long Term Goals - 03/25/18 0935      PT LONG TERM GOAL #1   Title  Pt's DGI will improve to 24/24 to demonstrate decreased fall risk    Baseline  17/24    Time  5    Period  Weeks    Status  New      PT LONG TERM GOAL #2   Title  Pt's Berg Balance Score will improve to at least 52/56 to demonstrate decreased fall risk    Baseline  44/56    Time  6    Period  Weeks    Status  New      PT LONG TERM GOAL #3   Title  Pt's SLS time will improve to at least 10 seconds each LE for improved balance    Baseline  3 sec each LE    Time  4    Period  Weeks    Status  New      PT LONG TERM GOAL #4   Title  Pt's tandem stance time will improve to at least 10 seconds with each LE in the rear to demonstrate improved balance    Baseline  4 seconds with R foot in front, 5 seconds with L foot in front    Time  4    Period  Weeks    Status  New            Plan - 04/09/18 1348    Clinical Impression Statement  Patient tolerated therapy session well. Pt performed walking on treadmill with 2 HHA; VCs for decreasing width of BOS to decrease "walking like a duck." Instructed pt in advanced balance tasks. Pt demonstrated increased difficulty with backwards walking with head turns with some sway laterally and veering off path. Pt performed single leg squats with faded UE support; able to perform mini squats without HHA with CGA for safety. Pt demonstrated improved performance with tandem walking on balance beam; decreased UE support with no LOB noted. Pt will continue to benefit from skilled PT intervention for improvements in balance and gait safety.    Rehab Potential  Good    PT Frequency  2x /  week    PT Duration  6 weeks    PT Treatment/Interventions  ADLs/Self Care Home Management;Aquatic Therapy;DME  Instruction;Gait training;Stair training;Functional mobility training;Therapeutic activities;Therapeutic exercise;Balance training;Neuromuscular re-education;Patient/family education;Manual techniques;Energy conservation;Taping    PT Next Visit Plan  advance HEP as appropriate, have pt fill out ABC scale    PT Home Exercise Plan  SLS, tandem stance, mini squats, heel raises, toe raises    Consulted and Agree with Plan of Care  Patient       Patient will benefit from skilled therapeutic intervention in order to improve the following deficits and impairments:  Abnormal gait, Decreased balance, Decreased endurance, Decreased safety awareness, Impaired perceived functional ability, Pain, Improper body mechanics, Postural dysfunction, Impaired sensation  Visit Diagnosis: Unsteadiness on feet  Other abnormalities of gait and mobility     Problem List There are no active problems to display for this patient.  Harriet Masson, SPT This entire session was performed under direct supervision and direction of a licensed therapist/therapist assistant . I have personally read, edited and approve of the note as written.  Trotter,Margaret PT, DPT 04/09/2018, 2:17 PM  [place] MAIN [facility] SERVICES [address] [place], [place], [address] Phone: [phone]   Fax:  [phone]  Name: Vanessa Newman MRN: [id number] Date of Birth: 07-Jul-1940

## 2018-04-14 ENCOUNTER — Ambulatory Visit: Payer: Medicare HMO | Attending: Neurology | Admitting: Physical Therapy

## 2018-04-14 ENCOUNTER — Encounter: Payer: Self-pay | Admitting: Physical Therapy

## 2018-04-14 DIAGNOSIS — R2689 Other abnormalities of gait and mobility: Secondary | ICD-10-CM | POA: Diagnosis present

## 2018-04-14 DIAGNOSIS — R2681 Unsteadiness on feet: Secondary | ICD-10-CM | POA: Diagnosis not present

## 2018-04-14 NOTE — Therapy (Addendum)
[place] MAIN [facility] SERVICES [address] [place], [place], [address] Phone: [phone]   Fax:  [phone]  Physical Therapy Treatment/Discharge Summary  Patient Details  Name: Vanessa Newman MRN: [id number] Date of Birth: 1940-07-10 Referring Provider (PT): Dr. Jennings Books   Encounter Date: 04/14/2018  PT End of Session - 04/14/18 1435    Visit Number  5    Number of Visits  13    Date for PT Re-Evaluation  05/05/18    Authorization Type  5/10 progress note    Authorization Time Period  start of reporting period 10/15    PT Start Time  1345    PT Stop Time  1430    PT Time Calculation (min)  45 min    Equipment Utilized During Treatment  Gait belt    Activity Tolerance  Patient tolerated treatment well    Behavior During Therapy  [facility] for tasks assessed/performed       Past Medical History:  Diagnosis Date  . Acid reflux   . Anxiety and depression   . Cystocele    2nd degree  . Fibroids   . Hypercholesteremia   . Hypertension   . Left lower quadrant pain   . Melanoma ([place])   . Rectocele    2nd degree  . SUI (stress urinary incontinence, female)   . Urinary incontinence   . Vaginal atrophy     Past Surgical History:  Procedure Laterality Date  . right knee replacement    . TOTAL HIP ARTHROPLASTY      There were no vitals filed for this visit.  Subjective Assessment - 04/14/18 1348    Subjective  Patient reports she is doing well today; went to line dancing class earlier today. She has been doing her HEP daily.     Pertinent History  Pt is a 77 y/o F who reports feeling imbalanced over the past year and feels as though she walks "like a duck" due to imbalance.  Pt denies any fall history.  Pt does not use AD to ambulate.  Pt feels that she sometimes drags her RLE.  Pt does [place] for the past 6 months and says this has helped with her balance.  Water aerobics 3 days/wk, dancing 1 day/wk.  Pt reports chronic pain  distally from Bil knees for 2-3 yrs, h/o neuropathy.  Current and best pain: 2/10.  Worst pain: 6/10.  Pt ind with ADLs and IADLs.  Pt's goals: to improve her balance and steadiness while walking.  Pt says she has Bil carpal tunnel which her MD is aware of and has medication for this.  Current pain: 1/10.      How long can you sit comfortably?  no issue    How long can you stand comfortably?  no issue    How long can you walk comfortably?  10 minutes    Patient Stated Goals  see above    Currently in Pain?  Yes    Pain Score  2     Pain Location  Leg    Pain Orientation  Right;Left    Pain Descriptors / Indicators  Burning;Dull;Tingling;Aching    Pain Type  Chronic pain    Pain Onset  More than a month ago    Pain Frequency  Constant         [facility] PT Assessment - 04/15/18 0001      Berg Balance Test   Sit to Stand  Able to stand without using hands and stabilize independently    Standing Unsupported  Able to stand safely 2 minutes    Sitting with Back Unsupported but Feet Supported on Floor or Stool  Able to sit safely and securely 2 minutes    Stand to Sit  Sits safely with minimal use of hands    Transfers  Able to transfer safely, minor use of hands    Standing Unsupported with Eyes Closed  Able to stand 10 seconds safely    Standing Ubsupported with Feet Together  Able to place feet together independently and stand 1 minute safely    From Standing, Reach Forward with Outstretched Arm  Can reach confidently >25 cm (10")    From Standing Position, Pick up Object from Floor  Able to pick up shoe safely and easily    From Standing Position, Turn to Look Behind Over each Shoulder  Looks behind from both sides and weight shifts well    Turn 360 Degrees  Able to turn 360 degrees safely in 4 seconds or less    Standing Unsupported, Alternately Place Feet on Step/Stool  Able to stand independently and safely and complete 8 steps in 20 seconds    Standing Unsupported, One Foot in Front   Able to plae foot ahead of the other independently and hold 30 seconds    Standing on One Leg  Able to lift leg independently and hold 5-10 seconds    Total Score  54      Dynamic Gait Index   Level Surface  Normal    Change in Gait Speed  Normal    Gait with Horizontal Head Turns  Normal    Gait with Vertical Head Turns  Normal    Gait and Pivot Turn  Normal    Step Over Obstacle  Normal    Step Around Obstacles  Normal    Steps  Normal    Total Score  24       Treatment Advanced HEP:  -Eyes closed, wide BOS x10 sec hold, feet together x10 sec hold -Eyes closed, wide BOS with head turns x5 turns each direction -Eyes closed, overhead arm lifts x5 lifts -Eyes closed, tandem stance x10 sec holds   Assessed all outcome measures; demonstrated significant improvement in all outcome measures and met all goals except single leg stance time > 10 sec (able to maintain SLS for 7 sec on each leg)            PT Education - 04/14/18 1351    Education Details  exercise technique/form, balance    Person(s) Educated  Patient    Methods  Explanation;Demonstration;Verbal cues    Comprehension  Verbalized understanding;Returned demonstration       PT Short Term Goals - 04/14/18 1407      PT SHORT TERM GOAL #1   Title  Pt will complete HEP at least 4 days/wk for improved carryover between sessions    Time  2    Period  Weeks    Status  Achieved        PT Long Term Goals - 04/14/18 1408      PT LONG TERM GOAL #1   Title  Pt's DGI will improve to 24/24 to demonstrate decreased fall risk    Baseline  17/24; 04/14/18: 24/24    Time  5    Period  Weeks    Status  Achieved      PT LONG TERM GOAL #2  Title  Pt's Berg Balance Score will improve to at least 52/56 to demonstrate decreased fall risk    Baseline  44/56; 04/14/18: 54/56    Time  6    Period  Weeks    Status  Achieved      PT LONG TERM GOAL #3   Title  Pt's SLS time will improve to at least 10 seconds each LE  for improved balance    Baseline  3 sec each LE; 04/14/18: about 7 sec on each LE    Time  4    Period  Weeks    Status  Partially Met      PT LONG TERM GOAL #4   Title  Pt's tandem stance time will improve to at least 10 seconds with each LE in the rear to demonstrate improved balance    Baseline  4 seconds with R foot in front, 5 seconds with L foot in front    Time  4    Period  Weeks    Status  Achieved            Plan - 04/14/18 1443    Clinical Impression Statement  Patient tolerated therapy session well. Pt demonstrated improvements in all outcome measures; improved postural control and stability in ambulation. Pt ambulated with decreased BOS and did not present with a "duck like" gait pattern. Pt verbalized desire to discontinue therapy at this time due to feeling as though her balance has improved and will continue to improve with her tai chi classes, aerobic water classes, and line dancing classes. Advanced pt's HEP to include eyes closed balance exercises. Pt is being discharged from therapy at this time due to meeting majority of her goals and requesting to end therapy at this time secondary to high co-pay as well.    Rehab Potential  Good    PT Frequency  2x / week    PT Duration  6 weeks    PT Treatment/Interventions  ADLs/Self Care Home Management;Aquatic Therapy;DME Instruction;Gait training;Stair training;Functional mobility training;Therapeutic activities;Therapeutic exercise;Balance training;Neuromuscular re-education;Patient/family education;Manual techniques;Energy conservation;Taping    PT Next Visit Plan  advance HEP as appropriate, have pt fill out ABC scale    PT Home Exercise Plan  SLS, tandem stance, mini squats, heel raises, toe raises    Consulted and Agree with Plan of Care  Patient       Patient will benefit from skilled therapeutic intervention in order to improve the following deficits and impairments:  Abnormal gait, Decreased balance, Decreased  endurance, Decreased safety awareness, Impaired perceived functional ability, Pain, Improper body mechanics, Postural dysfunction, Impaired sensation  Visit Diagnosis: Unsteadiness on feet  Other abnormalities of gait and mobility     Problem List There are no active problems to display for this patient.  Harriet Masson, SPT This entire session was performed under direct supervision and direction of a licensed therapist/therapist assistant . I have personally read, edited and approve of the note as written.  Trotter,Margaret PT, DPT 04/15/2018, 8:11 AM  [place] MAIN [facility] SERVICES [address] [place], [place], [address] Phone: [phone]   Fax:  [phone]  Name: Vanessa Newman MRN: [id number] Date of Birth: 18-Dec-1940

## 2018-04-14 NOTE — Patient Instructions (Signed)
  Feet Apart, Varied Arm Positions - Eyes Closed   Stand in corner (try to not lean on wall) with chair out in front of you for safety. Stand with feet shoulder width apart and arms by side. Close eyes and visualize upright position. Hold __10__ seconds. The open eyes and hold for 10 sec. Repeat alternating between eyes open/closed; As this gets easier, try with feet close together.  Repeat __5__ times per session. Do _2-3___ sessions per day.  Copyright  VHI. All rights reserved.  Feet Apart, Head Motion - Eyes Closed  Stand in corner (try to not lean on wall) with chair out in front of you for safety. With eyes closed and feet shoulder width apart, move head slowly, up and down 5 times, then open eyes and try to find your balance. Then try with eyes closed and move head side/side x5 times. Repeat _2___ times each direction per session. Do __2__ sessions per day.  Copyright  VHI. All rights reserved.  Feet Apart, Arm Motion - Eyes Closed  Stand in corner (try to not lean on wall) with chair out in front of you for safety. With eyes closed and feet shoulder width apart, move arms up and down: to front. Try to keep feet still and keep a good posture as you move your arms.  Repeat __10__ times per session. Do __2__ sessions per day.  Copyright  VHI. All rights reserved.  Feet Heel-Toe "Tandem", Varied Arm Positions - Eyes Closed  Stand in corner (try to not lean on wall) with chair out in front of you for safety. Stand with right foot directly in front of the other and arms out. Close eyes and visualize upright position. Hold_5-10__ seconds. Repeat _2-3___ times per session. Do __2__ sessions per day.  Copyright  VHI. All rights reserved.    To increase difficulty of any exercise, try standing on couch cushion or pillow for uneven surface.  Make sure that you practice good safety with having objects nearby to grab ahold of if you start to loose your balance.  If you start to  get dizzy, please stop and take a rest break.

## 2018-04-16 ENCOUNTER — Ambulatory Visit: Payer: Medicare HMO | Admitting: Physical Therapy

## 2018-04-21 ENCOUNTER — Ambulatory Visit: Payer: Medicare HMO | Admitting: Physical Therapy

## 2018-04-23 ENCOUNTER — Ambulatory Visit: Payer: Medicare HMO | Admitting: Physical Therapy

## 2018-04-28 ENCOUNTER — Ambulatory Visit: Payer: Medicare HMO | Admitting: Physical Therapy

## 2018-04-30 ENCOUNTER — Ambulatory Visit: Payer: Medicare HMO | Admitting: Physical Therapy

## 2018-05-06 ENCOUNTER — Ambulatory Visit: Payer: Medicare HMO | Admitting: Physical Therapy

## 2018-07-15 ENCOUNTER — Ambulatory Visit: Payer: Self-pay | Admitting: Urology

## 2018-07-31 ENCOUNTER — Other Ambulatory Visit: Payer: Self-pay | Admitting: Family Medicine

## 2018-07-31 DIAGNOSIS — Z1231 Encounter for screening mammogram for malignant neoplasm of breast: Secondary | ICD-10-CM

## 2018-07-31 NOTE — Progress Notes (Addendum)
08/04/2018  1:46 PM   Vanessa Newman Oct 23, 1940 [id number]  Referring provider: Denton Lank, MD [address] [place], [place] [address]  Chief Complaint  Patient presents with  . Urinary Incontinence    HPI: Vanessa Newman is a 78 y.o. White or Caucasian female that presents today in consultation with Dr. Posey Pronto for mixed urinary incontinence.   She was previously followed/ evalatued by Dr. Rock Nephew at [facility] for the same issue. Previously tried premarin cream but stopped when it became cost inhibitive. She is menopausal and has had 3 vaginal deliveries [phone]). Urodynamics were recommended.  She never follow up.  Patient experiences leakage with coughing and wears depends nightly because as soon as her feet hit the floor in the morning she has an accident. She goes to the gym 3 times a week and does not experience episodes of incontinence during her workouts.   Patient reports using premarin cream twice weekly for a year. She is currently sexually active. Denies vaginal dryness or pain with sexual intercourse.    Patient has been taking AZO Bladder Control daily for about one month because she believes it helps with her stress incontinence. She can notice a difference on the days that she misses her dose. Denies frequency, urgency, burning or dysuria. Reports toilet mapping. Denies UTIs within the last year.  Denies constipation and feelings of vaginal bulging.   PMH: Past Medical History:  Diagnosis Date  . Acid reflux   . Anxiety and depression   . Cystocele    2nd degree  . Fibroids   . Hypercholesteremia   . Hypertension   . Left lower quadrant pain   . Melanoma ([place])   . Rectocele    2nd degree  . SUI (stress urinary incontinence, female)   . Urinary incontinence   . Vaginal atrophy     Surgical History: Past Surgical History:  Procedure Laterality Date  . right knee replacement    . TOTAL HIP ARTHROPLASTY      Home Medications:  Allergies as of 08/04/2018    No Known Allergies     Medication List       Accurate as of August 04, 2018  1:46 PM. Always use your most recent med list.        aspirin 81 MG tablet Take 81 mg by mouth daily.   atorvastatin 40 MG tablet Commonly known as:  LIPITOR Take 40 mg by mouth daily.   CALCIUM 600 600 MG Tabs tablet Generic drug:  calcium carbonate Take 600 mg by mouth daily with breakfast.   citalopram 20 MG tablet Commonly known as:  CELEXA Take 20 mg by mouth daily.   estrogens (conjugated) 0.625 MG tablet Commonly known as:  PREMARIN Take 0.625 mg by mouth daily. Take daily for 21 days then do not take for 7 days.   EYE VITAMINS & MINERALS PO Take by mouth.   gabapentin 800 MG tablet Commonly known as:  NEURONTIN Take 800 mg by mouth 3 (three) times daily.   HM POTASSIUM 595 (99 K) MG Tabs tablet Generic drug:  potassium gluconate Take 595 mg by mouth.   HYDROcodone-acetaminophen 5-325 MG tablet Commonly known as:  NORCO/VICODIN Take 1 tablet by mouth every 6 (six) hours as needed for moderate pain.   ibuprofen 600 MG tablet Commonly known as:  ADVIL,MOTRIN Take 600 mg by mouth every 8 (eight) hours as needed.   omeprazole 20 MG capsule Commonly known as:  PRILOSEC Take 20 mg by  mouth daily.   senna 8.6 MG Tabs tablet Commonly known as:  SENOKOT Take 1 tablet by mouth.   triamterene-hydrochlorothiazide 37.5-25 MG tablet Commonly known as:  MAXZIDE-25 Take 1 tablet by mouth daily.   Vitamin D (Cholecalciferol) 10 MCG (400 UNIT) Chew Chew 400 Units by mouth daily.       Allergies: No Known Allergies  Family History: Family History  Problem Relation Age of Onset  . Breast cancer Mother 49  . Colon cancer Father   . Cancer Father        lung    Social History:  reports that she has never smoked. She has never used smokeless tobacco. She reports that she does not drink alcohol or use drugs.  ROS: UROLOGY Frequent Urination?: No Hard to postpone  urination?: No Burning/pain with urination?: No Get up at night to urinate?: No Leakage of urine?: Yes Urine stream starts and stops?: No Trouble starting stream?: No Do you have to strain to urinate?: No Blood in urine?: No Urinary tract infection?: No Sexually transmitted disease?: No Injury to kidneys or bladder?: No Painful intercourse?: No Weak stream?: No Currently pregnant?: No Vaginal bleeding?: No Last menstrual period?: n  Gastrointestinal Nausea?: No Vomiting?: No Indigestion/heartburn?: No Diarrhea?: No Constipation?: No  Constitutional Fever: No Night sweats?: No Weight loss?: No Fatigue?: No  Skin Skin rash/lesions?: No Itching?: No  Eyes Blurred vision?: No Double vision?: No  Ears/Nose/Throat Sore throat?: No Sinus problems?: No  Hematologic/Lymphatic Swollen glands?: No Easy bruising?: No  Cardiovascular Leg swelling?: No Chest pain?: No  Respiratory Cough?: No Shortness of breath?: No  Endocrine Excessive thirst?: No  Musculoskeletal Back pain?: No Joint pain?: No  Neurological Headaches?: No Dizziness?: No  Psychologic Depression?: No Anxiety?: No  Physical Exam: BP (!) 145/77   Pulse 74   Ht 5\' 4"  (1.626 m)   Wt 186 lb (84.4 kg)   BMI 31.93 kg/m   Constitutional:  Alert and oriented, No acute distress. Respiratory: Normal respiratory effort, no increased work of breathing. Head: Normocephalic and Atraumatic. GU: No CVA tenderness Pelvic: Chaperone present. Normal external genitalia, otherwise unremarkable.  Atrophic vaginitis. Urethral  hypermobility no obvious cystocele good, apical support, and very small rectocele. Skin: No rashes, bruises or suspicious lesions. Neurologic: Grossly intact, no focal deficits, moving all 4 extremities. Psychiatric: Normal mood and affect.  Laboratory Data: Lab Results  Component Value Date   WBC 10.7 12/27/2011   HGB 10.8 (L) 12/27/2011   HCT 34.2 (L) 12/27/2011   MCV 83  12/27/2011   PLT 344 12/27/2011    Lab Results  Component Value Date   CREATININE 1.04 12/27/2011   Urinalysis Microscopic Exam  Ref Range and Units  WBC 0-5 0-5/hpf  RBC None 0-2/hpf  Epithelial Cells 0-10 0-10/hpf (non-renal)  Renal Epithelial Cells None /hpf  Casts None None seen/lpf  Cast Type None   Crystals None None seen/lpf  Crystal Type None   Mucus Threads None Not established/lp  Bacteria Many None to few seen  Yeast None None seen  Trichomonas None None seen  Dipstick Results    NIT Negative Negative  LEU Trace    Pertinent Imaging: PVR is 60 ml  Assessment & Plan:    1. Stress Urinary Incontinence  - Discussed management options of physical therapy/pelvic floor exercises, weight loss or pelvic sling procedure  - Return as needed with Dr. Matilde Sprang  2. Urge Incontinence  - Patient finds AZO bladder control to be helpful enough with these  symptoms, but is interested in a trial of Myrbetriq for comparison, side effects discussed  - Provided with Myrbetriq 25 mg #30            - She will let us know if she would like to pursue pharmacotherapy             -Behavioral modification discussed  3. Rectocele  - Asymptomatic, would not recommend further intervention at this time  Return as needed with Dr. Matilde Sprang.  [facility] [address], [place], [place] [address] [phone]  I, Stephania Fragmin , am acting as a scribe for Hollice Espy, MD  I have reviewed the above documentation for accuracy and completeness, and I agree with the above.   Hollice Espy, MD  I spent 45 min with this patient of which greater than 50% was spent in counseling and coordination of care with the patient.

## 2018-08-04 ENCOUNTER — Encounter: Payer: Self-pay | Admitting: Urology

## 2018-08-04 ENCOUNTER — Ambulatory Visit: Payer: Medicare HMO | Admitting: Urology

## 2018-08-04 VITALS — BP 145/77 | HR 74 | Ht 64.0 in | Wt 186.0 lb

## 2018-08-04 DIAGNOSIS — N393 Stress incontinence (female) (male): Secondary | ICD-10-CM | POA: Diagnosis not present

## 2018-08-04 DIAGNOSIS — Z87898 Personal history of other specified conditions: Secondary | ICD-10-CM | POA: Insufficient documentation

## 2018-08-04 DIAGNOSIS — N816 Rectocele: Secondary | ICD-10-CM | POA: Diagnosis not present

## 2018-08-04 DIAGNOSIS — N3941 Urge incontinence: Secondary | ICD-10-CM | POA: Diagnosis not present

## 2018-08-04 LAB — URINALYSIS, COMPLETE
Bilirubin, UA: NEGATIVE
GLUCOSE, UA: NEGATIVE
Ketones, UA: NEGATIVE
Nitrite, UA: NEGATIVE
PROTEIN UA: NEGATIVE
Specific Gravity, UA: 1.02 (ref 1.005–1.030)
Urobilinogen, Ur: 1 mg/dL (ref 0.2–1.0)
pH, UA: 7 (ref 5.0–7.5)

## 2018-08-04 LAB — MICROSCOPIC EXAMINATION: RBC MICROSCOPIC, UA: NONE SEEN /HPF (ref 0–2)

## 2018-11-24 ENCOUNTER — Other Ambulatory Visit: Payer: Self-pay

## 2018-11-24 ENCOUNTER — Ambulatory Visit
Admission: RE | Admit: 2018-11-24 | Discharge: 2018-11-24 | Disposition: A | Discharge status: Home / Self Care | Payer: Medicare HMO | Source: Ambulatory Visit | Attending: Family Medicine | Admitting: Family Medicine

## 2018-11-24 DIAGNOSIS — Z1231 Encounter for screening mammogram for malignant neoplasm of breast: Secondary | ICD-10-CM

## 2018-11-30 ENCOUNTER — Ambulatory Visit: Payer: Medicare HMO | Admitting: Urology

## 2018-12-14 ENCOUNTER — Ambulatory Visit: Payer: Medicare HMO | Admitting: Urology

## 2018-12-14 ENCOUNTER — Encounter: Payer: Self-pay | Admitting: Urology

## 2019-01-26 DIAGNOSIS — Z86010 Personal history of colonic polyps: Secondary | ICD-10-CM | POA: Insufficient documentation

## 2019-01-26 DIAGNOSIS — D509 Iron deficiency anemia, unspecified: Secondary | ICD-10-CM | POA: Insufficient documentation

## 2019-01-26 DIAGNOSIS — R151 Fecal smearing: Secondary | ICD-10-CM | POA: Insufficient documentation

## 2019-04-15 ENCOUNTER — Other Ambulatory Visit: Payer: Self-pay | Admitting: Family Medicine

## 2019-04-15 ENCOUNTER — Ambulatory Visit
Admission: RE | Admit: 2019-04-15 | Discharge: 2019-04-15 | Disposition: A | Discharge status: Home / Self Care | Payer: Medicare HMO | Source: Ambulatory Visit | Attending: Family Medicine | Admitting: Family Medicine

## 2019-04-15 DIAGNOSIS — R062 Wheezing: Secondary | ICD-10-CM

## 2019-05-25 ENCOUNTER — Other Ambulatory Visit
Admission: RE | Admit: 2019-05-25 | Discharge: 2019-05-25 | Disposition: A | Discharge status: Home / Self Care | Payer: Medicare HMO | Source: Ambulatory Visit | Attending: Pulmonary Disease | Admitting: Pulmonary Disease

## 2019-05-25 ENCOUNTER — Ambulatory Visit (INDEPENDENT_AMBULATORY_CARE_PROVIDER_SITE_OTHER): Payer: Medicare HMO | Admitting: Pulmonary Disease

## 2019-05-25 ENCOUNTER — Other Ambulatory Visit: Payer: Self-pay

## 2019-05-25 ENCOUNTER — Encounter: Payer: Self-pay | Admitting: Pulmonary Disease

## 2019-05-25 VITALS — BP 146/78 | HR 78 | Temp 97.1°F | Ht 64.0 in | Wt 208.0 lb

## 2019-05-25 DIAGNOSIS — R05 Cough: Secondary | ICD-10-CM

## 2019-05-25 DIAGNOSIS — K219 Gastro-esophageal reflux disease without esophagitis: Secondary | ICD-10-CM

## 2019-05-25 DIAGNOSIS — J45901 Unspecified asthma with (acute) exacerbation: Secondary | ICD-10-CM

## 2019-05-25 DIAGNOSIS — R0602 Shortness of breath: Secondary | ICD-10-CM

## 2019-05-25 DIAGNOSIS — R059 Cough, unspecified: Secondary | ICD-10-CM

## 2019-05-25 LAB — CBC WITH DIFFERENTIAL/PLATELET
Abs Immature Granulocytes: 0.04 10*3/uL (ref 0.00–0.07)
Basophils Absolute: 0.1 10*3/uL (ref 0.0–0.1)
Basophils Relative: 1 %
Eosinophils Absolute: 0.7 10*3/uL — ABNORMAL HIGH (ref 0.0–0.5)
Eosinophils Relative: 7 %
HCT: 36.8 % (ref 36.0–46.0)
Hemoglobin: 11.7 g/dL — ABNORMAL LOW (ref 12.0–15.0)
Immature Granulocytes: 0 %
Lymphocytes Relative: 24 %
Lymphs Abs: 2.4 10*3/uL (ref 0.7–4.0)
MCH: 27.7 pg (ref 26.0–34.0)
MCHC: 31.8 g/dL (ref 30.0–36.0)
MCV: 87 fL (ref 80.0–100.0)
Monocytes Absolute: 0.8 10*3/uL (ref 0.1–1.0)
Monocytes Relative: 8 %
Neutro Abs: 6 10*3/uL (ref 1.7–7.7)
Neutrophils Relative %: 60 %
Platelets: 369 10*3/uL (ref 150–400)
RBC: 4.23 MIL/uL (ref 3.87–5.11)
RDW: 15.6 % — ABNORMAL HIGH (ref 11.5–15.5)
WBC: 10 10*3/uL (ref 4.0–10.5)
nRBC: 0 % (ref 0.0–0.2)

## 2019-05-25 MED ORDER — AEROCHAMBER MV MISC: 0 refills | Status: DC

## 2019-05-25 MED ORDER — BREO ELLIPTA 100-25 MCG/INH IN AEPB: 1.0000 | INHALATION_SPRAY | Freq: Every day | RESPIRATORY_TRACT | 0 refills | Status: DC

## 2019-05-25 NOTE — Patient Instructions (Addendum)
1.  You likely have asthma, we will start you on Breo Ellipta 1 inhalation daily.  Make sure you rinse your mouth well after use it.  You can put a little baking soda in the rinse water to help with preventing thrush.  2.  You can use your rescue inhaler (red one) 2 puffs up to 4 times a day as needed for shortness of breath.  3.  We are getting some blood samples to determine about potential allergies as cause for your asthma.  4.  We will see you in follow-up in 3 to 4 weeks time call sooner should you have any new difficulties.  At that time we will schedule you for breathing tests.  5.  Take your Prilosec (omeprazole) daily for now.

## 2019-05-25 NOTE — Addendum Note (Signed)
Addended by: Maryanna Shape A on: 05/25/2019 10:40 AM   Modules accepted: Orders

## 2019-05-25 NOTE — Progress Notes (Signed)
Subjective:    Patient ID: Vanessa Newman, female    DOB: [birth year], 78 y.o.   MRN: [id number]  HPI 78 year old lifelong never smoker presents for evaluation of a 57-month long nonproductive cough, wheezing and dyspnea.  She is kindly referred by Dr. Denton Newman.  Patient states that the issue started 4 months ago after a bout with shingles.  She does have issues with seasonal allergies but has not ever had difficulties with cough or shortness of breath.  She has been tested for COVID-19 and has been negative.  He does not note anything to exacerbate her symptoms as she perceives that these are constant.  She does have issues with reflux however she does take omeprazole on as-needed basis and does not feel that this has been a major issue.  He has not had any fevers, chills or sweats.  As noted the cough has been nonproductive and she has had no hemoptysis.  She does note wheezing particularly when she lays down at night.  No orthopnea or paroxysmal nocturnal dyspnea.  No lower extremity edema.  No other symptomatology.  She has been prescribed an albuterol inhaler however is not sure of how to use it and has not been using it.  Past medical history, surgical history and family history have been reviewed.  Social history she is a lifelong never smoker and has never had environmental exposures.  No secondhand smoke exposure.  Review of Systems  Constitutional: Negative.   HENT: Negative.   Eyes: Negative.   Respiratory: Positive for cough, shortness of breath and wheezing.   Cardiovascular: Negative.   Gastrointestinal: Negative.   Endocrine: Negative.   Genitourinary: Negative.   Musculoskeletal: Negative.   Skin: Negative.   Allergic/Immunologic: Positive for environmental allergies.  Neurological: Negative.   Hematological: Negative.   Psychiatric/Behavioral: Negative.   All other systems reviewed and are negative.      Objective:   Physical Exam Vitals and nursing note reviewed.    Constitutional:      General: She is not in acute distress.    Appearance: She is obese. She is not ill-appearing.  HENT:     Head: Normocephalic and atraumatic.     Right Ear: External ear normal.     Left Ear: External ear normal.     Nose: Nose normal.     Mouth/Throat:     Comments: Nose/mouth/throat not examined due to masking requirements for COVID 19. Eyes:     General: No scleral icterus.    Conjunctiva/sclera: Conjunctivae normal.     Pupils: Pupils are equal, round, and reactive to light.  Neck:     Thyroid: No thyromegaly.     Trachea: Phonation normal. No tracheal deviation.  Cardiovascular:     Rate and Rhythm: Normal rate and regular rhythm.     Pulses: Normal pulses.     Heart sounds: Normal heart sounds.  Pulmonary:     Effort: Pulmonary effort is normal.     Breath sounds: Wheezing and rhonchi present. No rales.  Abdominal:     General: Bowel sounds are normal. There is no distension.     Palpations: Abdomen is soft.  Musculoskeletal:     Cervical back: Neck supple.     Right lower leg: No edema.     Left lower leg: No edema.  Lymphadenopathy:     Cervical: No cervical adenopathy.  Skin:    General: Skin is warm and dry.  Neurological:     General: No focal  deficit present.     Mental Status: She is alert and oriented to person, place, and time.  Psychiatric:        Mood and Affect: Mood normal.        Behavior: Behavior normal.    Recent chest x-ray performed was on April 15, 2019 it was without any significant abnormalities as below:       Assessment & Plan:    1.  Asthma/asthmatic bronchitis with poor control: She has had issues with "allergies" with seasonal variation and suspect that she may have had mild intermittent asthma however now has become more of a persistent issue.  She will need PFTs however I would like to get her better controlled.  We will start her on Breo Ellipta 100/25, 1 inhalation daily.  She was taught the proper use of  the dry powder inhaler.  In addition we instructed her on the proper use of the ProAir HFA and provided her with a spacer for use with the inhaler.  We will also check IgE, RAST, CBC with differential.  2.  Cough: Likely due to the above.  She has significant bronchospasm this is likely triggering the symptom.  She is not on ACE inhibitors or other medications that may aggravate this.  She does have a history of gastroesophageal reflux and I have asked her to take her proton pump inhibitor daily until we get her symptoms of asthma controlled.  3.  Shortness of breath: Due to #1 above.  This likely will improve with management of her bronchospasm.  4.  Gastroesophageal reflux: Issue adds complexity to her management.  As noted above we have asked her to take her proton pump inhibitor daily until we get her symptoms control.  She has been taking it only as needed up to now.  Thank you for allowing me to participate in this patient's care.   Vanessa Don, MD  PCCM   *This note was dictated using voice recognition software/Dragon.  Despite best efforts to proofread, errors can occur which can change the meaning.  Any change was purely unintentional.

## 2019-05-26 ENCOUNTER — Telehealth: Payer: Self-pay | Admitting: Pulmonary Disease

## 2019-05-26 MED ORDER — BREO ELLIPTA 100-25 MCG/INH IN AEPB: 1.0000 | INHALATION_SPRAY | Freq: Every day | RESPIRATORY_TRACT | 5 refills | Status: AC

## 2019-05-26 NOTE — Telephone Encounter (Signed)
Rx for Breo 100 has been sent to preferred pharmacy. Pt is aware and voiced her understanding.  Nothing further is needed.

## 2019-05-27 LAB — IGE: IgE (Immunoglobulin E), Serum: 57 IU/mL (ref 6–495)

## 2019-05-30 LAB — ALLERGENS W/TOTAL IGE AREA 2

## 2019-06-22 ENCOUNTER — Ambulatory Visit: Payer: Medicare HMO | Admitting: Pulmonary Disease

## 2019-06-24 ENCOUNTER — Telehealth: Payer: Self-pay | Admitting: Pulmonary Disease

## 2019-06-24 NOTE — Telephone Encounter (Signed)
Pt is aware that all PFT's have been postponed at this time due to covid. asvised pt that PFT will be scheduled once PFT's are back up and running.  Pt did not have any further questions. Nothing further is needed.

## 2019-06-30 ENCOUNTER — Encounter: Payer: Self-pay | Admitting: Pulmonary Disease

## 2019-06-30 ENCOUNTER — Ambulatory Visit: Payer: Medicare HMO | Admitting: Pulmonary Disease

## 2019-06-30 ENCOUNTER — Other Ambulatory Visit: Payer: Self-pay

## 2019-06-30 VITALS — BP 146/84 | HR 86 | Temp 97.8°F | Ht 64.0 in | Wt 199.0 lb

## 2019-06-30 DIAGNOSIS — R059 Cough, unspecified: Secondary | ICD-10-CM

## 2019-06-30 DIAGNOSIS — R0602 Shortness of breath: Secondary | ICD-10-CM

## 2019-06-30 DIAGNOSIS — J45998 Other asthma: Secondary | ICD-10-CM

## 2019-06-30 DIAGNOSIS — K219 Gastro-esophageal reflux disease without esophagitis: Secondary | ICD-10-CM

## 2019-06-30 DIAGNOSIS — R05 Cough: Secondary | ICD-10-CM

## 2019-06-30 NOTE — Progress Notes (Signed)
 Assessment & Plan:  1. Persistent asthma with undetermined severity (Primary)  2. Cough  3. Shortness of breath  4. Gastroesophageal reflux disease, unspecified whether esophagitis present   Patient Instructions  1.  Continue Breo Ellipta  as you are doing.  Continue rinsing well after use.   2.  Remember that albuterol  is as needed for shortness of breath or wheezing.   3.  We will see you in follow-up in 2 months time call sooner should any new difficulties arise.  1.  Asthma/asthmatic bronchitis with poor control: She has had issues with allergies with seasonal variation and suspect that she may have had mild intermittent asthma however now has become more of a persistent issue.  She will need PFTs however I would like to get her better controlled.  We will start her on Breo Ellipta  100/25, 1 inhalation daily.  She was taught the proper use of the dry powder inhaler.  In addition we instructed her on the proper use of the ProAir  HFA and provided her with a spacer for use with the inhaler.  We will also check IgE, RAST, CBC with differential.   2.  Cough: Likely due to the above.  She has significant bronchospasm this is likely triggering the symptom.  She is not on ACE inhibitors or other medications that may aggravate this.  She does have a history of gastroesophageal reflux and I have asked her to take her proton pump inhibitor daily until we get her symptoms of asthma controlled.   3.  Shortness of breath: Due to #1 above.  This likely will improve with management of her bronchospasm.   4.  Gastroesophageal reflux: Issue adds complexity to her management.  As noted above we have asked her to take her proton pump inhibitor daily until we get her symptoms control.  She has been taking it only as needed up to   Please note: late entry documentation due to logistical difficulties during COVID-19 pandemic. This note is filed for information purposes only, and is not intended to be  used for billing, nor does it represent the full scope/nature of the visit in question. Please see any associated scanned media linked to date of encounter for additional pertinent information.  Subjective:    HPI: Vanessa Newman is a 79 y.o. female presenting to the pulmonology clinic on 06/30/2019 with report of: Follow-up (pt states breathing has improved with Breo. c/o sob with exertion, prod cough with exertion and wheezing.)     Outpatient Encounter Medications as of 06/30/2019  Medication Sig   atorvastatin  (LIPITOR) 40 MG tablet Take 40 mg by mouth every morning.   calcium  carbonate (OS-CAL) 600 MG TABS tablet Take 600 mg by mouth daily with breakfast.   citalopram  (CELEXA ) 20 MG tablet Take 20 mg by mouth every morning.   gabapentin  (NEURONTIN ) 800 MG tablet Take 800 mg by mouth 3 (three) times daily.    senna (SENOKOT) 8.6 MG TABS tablet Take 1 tablet by mouth daily as needed.   Vitamin D , Cholecalciferol, 400 UNITS CHEW Chew 400 Units by mouth daily.   [DISCONTINUED] Albuterol  Sulfate (PROAIR  HFA IN) Inhale into the lungs. (Patient not taking: Reported on 05/23/2020)   [DISCONTINUED] aspirin  81 MG tablet Take 81 mg by mouth daily.   [DISCONTINUED] estrogens, conjugated, (PREMARIN) 0.625 MG tablet Take 0.625 mg by mouth daily. Take daily for 21 days then do not take for 7 days.   [DISCONTINUED] fluticasone  furoate-vilanterol (BREO ELLIPTA ) 100-25 MCG/INH AEPB Inhale  1 puff into the lungs daily. (Patient not taking: Reported on 05/23/2020)   [DISCONTINUED] HYDROcodone-acetaminophen  (NORCO/VICODIN) 5-325 MG tablet Take 1 tablet by mouth every 6 (six) hours as needed for moderate pain.   [DISCONTINUED] ibuprofen (ADVIL,MOTRIN) 600 MG tablet Take 400 mg by mouth every 8 (eight) hours as needed.   [DISCONTINUED] Multiple Vitamins-Minerals (EYE VITAMINS & MINERALS PO) Take by mouth.   [DISCONTINUED] omeprazole (PRILOSEC) 20 MG capsule Take 20 mg by mouth daily.   [DISCONTINUED] potassium  gluconate (HM POTASSIUM) 595 (99 K) MG TABS tablet Take 595 mg by mouth.   [DISCONTINUED] Spacer/Aero-Holding Chambers (AEROCHAMBER MV) inhaler Use as instructed   [DISCONTINUED] triamterene -hydrochlorothiazide  (MAXZIDE -25) 37.5-25 MG per tablet Take 1 tablet by mouth daily.   No facility-administered encounter medications on file as of 06/30/2019.      Objective:   Vitals:   06/30/19 1325  BP: (!) 146/84  Pulse: 86  Temp: 97.8 F (36.6 C)  Height: 5' 4 (1.626 m)  Weight: 199 lb (90.3 kg)  SpO2: 100%  TempSrc: Temporal  BMI (Calculated): 34.14     Physical exam documentation is limited by delayed entry of information.

## 2019-06-30 NOTE — Patient Instructions (Signed)
1.  Continue Breo Ellipta as you are doing.  Continue rinsing well after use.   2.  Remember that albuterol is as needed for shortness of breath or wheezing.   3.  We will see you in follow-up in 2 months time call sooner should any new difficulties arise.

## 2019-07-02 ENCOUNTER — Ambulatory Visit: Payer: Medicare HMO | Admitting: Pulmonary Disease

## 2019-08-10 ENCOUNTER — Other Ambulatory Visit: Payer: Self-pay

## 2019-08-10 ENCOUNTER — Ambulatory Visit: Payer: Medicare HMO | Attending: Student

## 2019-08-10 DIAGNOSIS — M4124 Other idiopathic scoliosis, thoracic region: Secondary | ICD-10-CM

## 2019-08-10 DIAGNOSIS — R278 Other lack of coordination: Secondary | ICD-10-CM | POA: Diagnosis present

## 2019-08-10 DIAGNOSIS — M62838 Other muscle spasm: Secondary | ICD-10-CM | POA: Diagnosis present

## 2019-08-10 DIAGNOSIS — M533 Sacrococcygeal disorders, not elsewhere classified: Secondary | ICD-10-CM | POA: Diagnosis present

## 2019-08-10 NOTE — Patient Instructions (Signed)
    Sit, feet flat, scoot forward to the edge of the chair. Inhale as you bend forward at hips, begin to exhale just before and while you stand, contracting the glutes, lower tummy muscles and pelvic floor as if stopping urination as you stand up.   * Do this every time you sit or stand! If you catch yourself doing it "wrong, re-set and do it again so it can become habit!     

## 2019-08-10 NOTE — Therapy (Signed)
[place] MAIN [facility] SERVICES [address] [place], [place], [address] Phone: [phone]   Fax:  [phone]  Physical Therapy Evaluation  The patient has been informed of current processes in place at Outpatient Rehab to protect patients from Covid-19 exposure including social distancing, schedule modifications, and new cleaning procedures. After discussing their particular risk with a therapist based on the patient's personal risk factors, the patient has decided to proceed with in-person therapy.   Patient Details  Name: Vanessa Newman MRN: [id number] Date of Birth: 1941/02/23 No data recorded  Encounter Date: 08/10/2019  PT End of Session - 08/12/19 0925    Visit Number  1    Number of Visits  10    Date for PT Re-Evaluation  10/21/19    Authorization Type  Medicare    Authorization Time Period  08/10/19 through 10/21/19    Authorization - Visit Number  1    Authorization - Number of Visits  10    Progress Note Due on Visit  10    PT Start Time  2409    PT Stop Time  1455    PT Time Calculation (min)  70 min    Activity Tolerance  Patient tolerated treatment well    Behavior During Therapy  [facility] for tasks assessed/performed       Past Medical History:  Diagnosis Date  . Acid reflux   . Anxiety and depression   . Cystocele    2nd degree  . Fibroids   . Hypercholesteremia   . Hypertension   . Left lower quadrant pain   . Melanoma ([place])   . Rectocele    2nd degree  . SUI (stress urinary incontinence, female)   . Urinary incontinence   . Vaginal atrophy     Past Surgical History:  Procedure Laterality Date  . right knee replacement Right 2010  . TOTAL HIP ARTHROPLASTY Right 2013    There were no vitals filed for this visit.          Pelvic Floor Physical Therapy Evaluation and Assessment  SCREENING  Falls in last 6 mo: no    Red Flags:  Have you had any night sweats? no Unexplained weight loss? no Saddle  anesthesia? no Unexplained changes in bowel or bladder habits? Yes  SUBJECTIVE  Patient reports: Her BM's have started being "mushy" and she has fecal incontinence with bending/lifting or running. No problem when she does water aerobics 3 times a  Week and dances when they are open. She has gained weight since she got married a year ago.  She is concerned about being "clean" finds fecal staining. Has a good sex life, her husband is 41 years younger. Has SUI when she coughs/sneezes, bends. Wears a light pad during the day. First thing in the morning she has a strong urge, wears depends and has to run to the bathroom.  Gets really bad cramps in the legs  Precautions:  Hx of skin CA, lumbar stenosis, exertional dyspnea, H/O chest pain, arthritis, THR, TKE on R. Asthma, Rectocele, Cystocele, Anxiety and Depression, Acid reflux, fibroids, HTN, Plantar fibromatosis.  Social/Family/Vocational History:   Retired Facilities manager:  none  Obstetrical History: 3 vaginal deliveries and 1 miscarriage.  Gynecological History: Fibroids, most menopausal   Urinary History: SUI and urge first thing in the morning, no nocturia.  Gastrointestinal History: BM daily, soft "mushy"  Sexual activity/pain: No pain  Location of pain: LBP Current pain:  4/10  Max pain:  8/10 Least pain:  0/10 Nature of pain: dull achy  Patient Goals: To not have any bowel or urinary incontinence.   OBJECTIVE  Posture/Observations:  Sitting:  Standing: leans R, leaning forward, weight on toes, anterior pelvic tilt. R iliac crest high, R PSIS high  Palpation/Segmental Motion/Joint Play:  Special tests:   Scoliosis: R knee bent with forward bend, R thoracic curve.  Range of Motion/Flexibilty:  Spine: R SB to knee, L SB ~ 3 fingers above knee, ROT WNL B, forward bend ~ 4 in. From floor. Hips:   Strength/MMT: Deferred to follow-up LE MMT  LE MMT Left Right  Hip flex:  (L2)  /5 /5  Hip ext: /5 /5  Hip abd: /5 /5  Hip add: /5 /5  Hip IR /5 /5  Hip ER /5 /5     Abdominal:  Palpation: TTP to R>L hip-flexors Diastasis: none   Pelvic Floor External Exam: Deferred to follow-up Introitus Appears:  Skin integrity:  Palpation: Cough: Prolapse visible?: Scar mobility:  Internal Vaginal Exam: Strength (PERF):  Symmetry: Palpation: Prolapse:   Internal Rectal Exam: Strength (PERF): Symmetry: Palpation: Prolapse:   Gait Analysis: Deferred to follow-up   Pelvic Floor Outcome Measures: FOTO PFDI Bowel: 13, Urinary Problem: 38, Bowel Leakage: 47, PFDI Urinary:  17  INTERVENTIONS THIS SESSION: Self-care: Educated on the structure and function of the pelvic floor in relation to their symptoms as well as the POC, and initial HEP in order to set patient expectations and understanding from which we will build on in the future sessions. Educated on and practiced how to make a new habit with how she sits/stands to decrease pressure on POP and decrease FI/SUI.  Total time: 70 min.           Objective measurements completed on examination: See above findings.                PT Short Term Goals - 08/12/19 0932      PT SHORT TERM GOAL #1   Title  Patient will demonstrate functional recruitment of TA with breathing, sit-to-stand, squatting/lifting, and walking to allow for improved pelvic brace coordination, improved balance, and decreased downward pressure on the pelvic organs.    Baseline  Pt. demonstrates breathing dysfunction and has POP    Time  5    Period  Weeks    Status  New    Target Date  09/16/19      PT SHORT TERM GOAL #2   Title  Patient will demonstrate improved sitting and standing posture to demonstrate learning and decrease stress on the pelvic floor with functional activity.    Baseline  hyper kyphotic/lordotic,    Time  5    Period  Weeks    Status  New    Target Date  09/16/19      PT SHORT TERM GOAL #3    Title  Patient will demonstrate HEP x1 in the clinic to demonstrate understanding and proper form to allow for further improvement.    Baseline  Pt. lacks knowledge of exercises that will help decreased Sx.    Time  5    Period  Weeks    Status  New    Target Date  09/16/19      PT SHORT TERM GOAL #4   Title  Patient will report a reduction in pain to no greater than 5/10 over the prior week to demonstrate symptom improvement.    Baseline  8/10 max  LBP    Time  5    Period  Weeks    Status  New    Target Date  09/16/19        PT Long Term Goals - 08/12/19 0942      PT LONG TERM GOAL #1   Title  Patient will demonstrate a coordinated contraction, relaxation, and bulge of the pelvic floor muscles to demonstrate functional recruitment and motion and allow for further strengthening.    Baseline  Pt. having FI, mixed UI, needing pantyliner during the day, Depends at night.    Time  10    Period  Weeks    Status  New    Target Date  10/21/19      PT LONG TERM GOAL #2   Title  Patient will report no episodes of SUI/UUI over the course of the prior two weeks to demonstrate improved functional ability.    Baseline  Pt. has leakage with bending, lifting, straining and sometimes has leakage first thing in the morning, always running in the morning.    Time  10    Period  Weeks    Status  New    Target Date  10/21/19      PT LONG TERM GOAL #3   Title  Patient will report no episodes of Fecal Incontinence over the past two weeks to demonstrate improved function and quality of life and to allow her to participate in Tai-Chi and dancing as part of her occupation.    Baseline  Having fecal smearing w/o awareness daily.    Time  10    Period  Weeks    Status  New    Target Date  10/21/19      PT LONG TERM GOAL #4   Title  Patient will describe pain no greater than 3/10 over the past week to demonstrate improved functional ability and increased QOL.    Baseline  8/10 max    Time  10     Period  Weeks    Status  New    Target Date  10/21/19      PT LONG TERM GOAL #5   Title  Pt. will improve in FOTO score by 10 points to demonstrate improved function.    Baseline  FOTO PFDI Bowel: 13, Urinary Problem: 38, Bowel Leakage: 47, PFDI Urinary:  17    Time  10    Period  Weeks    Status  New    Target Date  10/21/19             Plan - 08/12/19 2458    Clinical Impression Statement  Pt. is an 79 y/o female who presents today for cheif c/o fecal and mixed urinary incontinence. Her relevant PMH includes: Acid reflux, Anxiety and depression, Cystocele, (2nd degree) Fibroids, Hypercholesteremia, Hypertension, Left lower quadrant pain, Rectocele (2nd degree), and Vaginal atrophy. Her clinical exam revealed an anterior pelvic tilt/hyperkyphosis-hyperlordosis, a LLD with the RLE long, a mild R thoracic scoliotic curve, breathing dysfunction, Abnormal gait, and spasms surrounding the pelvis as well as historythat indicates PFM weakness, spasm, and por coordination. She will benefit from skilled pelvic health PT to address the noted deficits and to continue to assess for and address other potential causes of Sx.    Personal Factors and Comorbidities  Comorbidity 3+;Age    Comorbidities  Acid reflux, Anxiety and depression, Cystocele, (2nd degree) Fibroids, Hypercholesteremia, Hypertension, Left lower quadrant pain, Rectocele (2nd degree), Vaginal atrophy  Examination-Activity Limitations  Bend;Continence;Lift    Examination-Participation Restrictions  Interpersonal Relationship;Community Activity    Stability/Clinical Decision Making  Unstable/Unpredictable    Clinical Decision Making  High    Rehab Potential  Good    PT Frequency  1x / week    PT Duration  Other (comment)   10 weeks   PT Treatment/Interventions  ADLs/Self Care Home Management;Biofeedback;Ultrasound;Traction;Moist Heat;Electrical Stimulation;Gait training;Stair training;Functional mobility training;Neuromuscular  re-education;Balance training;Therapeutic exercise;Therapeutic activities;Patient/family education;Manual techniques;Scar mobilization;Passive range of motion;Dry needling;Taping;Spinal Manipulations;Joint Manipulations    PT Next Visit Plan  spasm reduction, posture education, Sacral mobs, give heel-lift    PT Home Exercise Plan  Sit-to stand    Consulted and Agree with Plan of Care  Patient       Patient will benefit from skilled therapeutic intervention in order to improve the following deficits and impairments:  Abnormal gait, Decreased balance, Decreased endurance, Difficulty walking, Increased muscle spasms, Cardiopulmonary status limiting activity, Decreased range of motion, Improper body mechanics, Decreased coordination, Decreased strength, Increased fascial restricitons, Postural dysfunction, Pain, Impaired flexibility  Visit Diagnosis: Other idiopathic scoliosis, thoracic region  Sacrococcygeal disorders, not elsewhere classified  Other muscle spasm  Other lack of coordination     Problem List Patient Active Problem List   Diagnosis Date Noted  . History of chest pain 08/04/2018  . Mixed incontinence urge and stress 05/30/2015  . Exertional dyspnea 01/12/2014  . Chest pain 01/12/2014  . Personal history of other malignant neoplasm of skin 09/27/2013  . 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 Synovitis 09/09/2013  . Myofascial muscle pain 09/09/2013  . Lumbar spinal stenosis 09/09/2013  . Hyperlipidemia, unspecified 09/09/2013  . HTN (hypertension) 09/09/2013  . GERD (gastroesophageal reflux disease) 09/09/2013  . Depression 09/09/2013   Willa Rough DPT, ATC Willa Rough 08/12/2019, 9:55 AM  [place] MAIN [facility] SERVICES [address] [place], [place], [address] Phone: [phone]   Fax:  [phone]  Name: ALAJAH WITMAN MRN: [id number] Date of Birth: 1940/10/24

## 2019-08-17 ENCOUNTER — Other Ambulatory Visit: Payer: Self-pay

## 2019-08-17 ENCOUNTER — Ambulatory Visit: Payer: Medicare HMO

## 2019-08-17 DIAGNOSIS — M533 Sacrococcygeal disorders, not elsewhere classified: Secondary | ICD-10-CM

## 2019-08-17 DIAGNOSIS — R278 Other lack of coordination: Secondary | ICD-10-CM

## 2019-08-17 DIAGNOSIS — M4124 Other idiopathic scoliosis, thoracic region: Secondary | ICD-10-CM | POA: Diagnosis not present

## 2019-08-17 DIAGNOSIS — M62838 Other muscle spasm: Secondary | ICD-10-CM

## 2019-08-17 NOTE — Therapy (Signed)
[place] MAIN [facility] SERVICES [address] [place], [place], [address] Phone: [phone]   Fax:  [phone]  Physical Therapy Treatment  The patient has been informed of current processes in place at Outpatient Rehab to protect patients from Covid-19 exposure including social distancing, schedule modifications, and new cleaning procedures. After discussing their particular risk with a therapist based on the patient's personal risk factors, the patient has decided to proceed with in-person therapy.   Patient Details  Name: Vanessa Newman MRN: [id number] Date of Birth: 1940/10/26 No data recorded  Encounter Date: 08/17/2019  PT End of Session - 08/17/19 1456    Visit Number  2    Number of Visits  10    Date for PT Re-Evaluation  10/21/19    Authorization Type  Medicare    Authorization Time Period  08/10/19 through 10/21/19    Authorization - Visit Number  2    Authorization - Number of Visits  10    Progress Note Due on Visit  10    PT Start Time  4193    PT Stop Time  1450    PT Time Calculation (min)  65 min    Activity Tolerance  Patient tolerated treatment well;No increased pain    Behavior During Therapy  WFL for tasks assessed/performed       Past Medical History:  Diagnosis Date  . Acid reflux   . Anxiety and depression   . Cystocele    2nd degree  . Fibroids   . Hypercholesteremia   . Hypertension   . Left lower quadrant pain   . Melanoma ([place])   . Rectocele    2nd degree  . SUI (stress urinary incontinence, female)   . Urinary incontinence   . Vaginal atrophy     Past Surgical History:  Procedure Laterality Date  . right knee replacement Right 2010  . TOTAL HIP ARTHROPLASTY Right 2013    There were no vitals filed for this visit.   Pelvic Floor Physical Therapy Treatment Note  SCREENING  Changes in medications, allergies, or medical history?: quit taking medications for asthma and feels better without the  medications. She feels like she is breathing better and not coughing as much without the medications.    SUBJECTIVE  Patient reports: She went to her water aerobics class and did "weight-bearing" exercises yesterday, feeling pretty good today. Her pain gets worse when she sweeps, mops, or vacuums.   Precautions:  Hx of skin CA, lumbar stenosis, exertional dyspnea, H/O chest pain, arthritis, THR, TKE on R. Asthma, Rectocele, Cystocele, Anxiety and Depression, Acid reflux, fibroids, HTN, Plantar fibromatosis.  Sexual activity/pain: No pain  Location of pain: LBP Current pain: 2/10  Max pain: 6/10 Least pain: 0/10 Nature of pain:dull achy  Patient Goals: To not have any bowel or urinary incontinence.   OBJECTIVE  Changes in: Posture/Observations:  Anterior pelvic tilt, RLE long (hip and knee replacements) R thoracic scoliosis, spasms surrounding the pelvis. Abnormal gait. Breathing dysfunction   Range of Motion/Flexibilty:  Decreased PIVM along entire spine other than ~ T7-10 with tenderness. Decreased thoracic rotation ROM B.   Strength/MMT:  LE MMT:  Pelvic floor:  Abdominal:   Palpation:  Gait Analysis: Pt. Spends more time on the LLE and lurches to the R with stance. circumducts RLE without bending the knee, lets her foot drop without eccentric control B, is rounded forward and has a R rib-shift/evident scoliotic curve.  INTERVENTIONS THIS SESSION: Manual:  Performed PA mobs to all sacral borders and all areas of restriction from C7-L5 to improve mobility of joint and surrounding connective tissue and decrease pressure on nerve roots for improved conductivity and function of down-stream tissues and allow acceptance of heel-lift.   Therex: educated on and practiced bow-and-arrow to improve mobility of joint and surrounding connective tissue and decrease pressure on nerve roots for improved conductivity and function of down-stream tissues.   Gait-training:  Educated on walking mechanics, emphasizing upright posture, equal stance time, bending knees, and heel-toe pattern. Given heel-lift for L shoe   Total time: 65 min.                             PT Short Term Goals - 08/12/19 0932      PT SHORT TERM GOAL #1   Title  Patient will demonstrate functional recruitment of TA with breathing, sit-to-stand, squatting/lifting, and walking to allow for improved pelvic brace coordination, improved balance, and decreased downward pressure on the pelvic organs.    Baseline  Pt. demonstrates breathing dysfunction and has POP    Time  5    Period  Weeks    Status  New    Target Date  09/16/19      PT SHORT TERM GOAL #2   Title  Patient will demonstrate improved sitting and standing posture to demonstrate learning and decrease stress on the pelvic floor with functional activity.    Baseline  hyper kyphotic/lordotic,    Time  5    Period  Weeks    Status  New    Target Date  09/16/19      PT SHORT TERM GOAL #3   Title  Patient will demonstrate HEP x1 in the clinic to demonstrate understanding and proper form to allow for further improvement.    Baseline  Pt. lacks knowledge of exercises that will help decreased Sx.    Time  5    Period  Weeks    Status  New    Target Date  09/16/19      PT SHORT TERM GOAL #4   Title  Patient will report a reduction in pain to no greater than 5/10 over the prior week to demonstrate symptom improvement.    Baseline  8/10 max LBP    Time  5    Period  Weeks    Status  New    Target Date  09/16/19        PT Long Term Goals - 08/12/19 0942      PT LONG TERM GOAL #1   Title  Patient will demonstrate a coordinated contraction, relaxation, and bulge of the pelvic floor muscles to demonstrate functional recruitment and motion and allow for further strengthening.    Baseline  Pt. having FI, mixed UI, needing pantyliner during the day, Depends at night.    Time  10    Period  Weeks     Status  New    Target Date  10/21/19      PT LONG TERM GOAL #2   Title  Patient will report no episodes of SUI/UUI over the course of the prior two weeks to demonstrate improved functional ability.    Baseline  Pt. has leakage with bending, lifting, straining and sometimes has leakage first thing in the morning, always running in the morning.    Time  10    Period  Weeks    Status  New    Target Date  10/21/19      PT LONG TERM GOAL #3   Title  Patient will report no episodes of Fecal Incontinence over the past two weeks to demonstrate improved function and quality of life and to allow her to participate in Tai-Chi and dancing as part of her occupation.    Baseline  Having fecal smearing w/o awareness daily.    Time  10    Period  Weeks    Status  New    Target Date  10/21/19      PT LONG TERM GOAL #4   Title  Patient will describe pain no greater than 3/10 over the past week to demonstrate improved functional ability and increased QOL.    Baseline  8/10 max    Time  10    Period  Weeks    Status  New    Target Date  10/21/19      PT LONG TERM GOAL #5   Title  Pt. will improve in FOTO score by 10 points to demonstrate improved function.    Baseline  FOTO PFDI Bowel: 13, Urinary Problem: 38, Bowel Leakage: 47, PFDI Urinary:  17    Time  10    Period  Weeks    Status  New    Target Date  10/21/19            Plan - 08/17/19 1456    Clinical Impression Statement  Pt. Responded well to all interventions today, demonstrating improved gait mechanics, decreased pain, and improved spinal mobility as well as understanding and correct performance of all education and exercises provided today. They will continue to benefit from skilled physical therapy to work toward remaining goals and maximize function as well as decrease likelihood of symptom increase or recurrence.     PT Next Visit Plan  spasm reduction, assess PFM, give tea-pot leaning to R    PT Home Exercise Plan  Sit-to  stand, bow-and-arrow, walking mechanics, heel lift in L.    Consulted and Agree with Plan of Care  Patient       Patient will benefit from skilled therapeutic intervention in order to improve the following deficits and impairments:     Visit Diagnosis: Other idiopathic scoliosis, thoracic region  Sacrococcygeal disorders, not elsewhere classified  Other muscle spasm  Other lack of coordination     Problem List Patient Active Problem List   Diagnosis Date Noted  . History of chest pain 08/04/2018  . Mixed incontinence urge and stress 05/30/2015  . Exertional dyspnea 01/12/2014  . Chest pain 01/12/2014  . Personal history of other malignant neoplasm of skin 09/27/2013  . 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 Synovitis 09/09/2013  . Myofascial muscle pain 09/09/2013  . Lumbar spinal stenosis 09/09/2013  . Hyperlipidemia, unspecified 09/09/2013  . HTN (hypertension) 09/09/2013  . GERD (gastroesophageal reflux disease) 09/09/2013  . Depression 09/09/2013   Willa Rough DPT, ATC Willa Rough 08/17/2019, 4:22 PM  [place] MAIN [facility] SERVICES [address] [place], [place], [address] Phone: [phone]   Fax:  [phone]  Name: MALLARY KREGER MRN: [id number] Date of Birth: Jan 31, 1941

## 2019-08-17 NOTE — Patient Instructions (Addendum)
  When seated, you want to maintain pelvic neutral with the shoulders gently down and back and ears in line with your shoulders. A lumbar roll such as the one below or a home-made towel-roll can be used for this purpose. Even Olympic athletes can only maintain proper seated posture for about 10 minutes without support!    Pictured: The Original [employer] Lumbar Roll    Let the top arm rest on your side, and slide along the torso as you rotate. Breathe in as you come forward, out as you open up. Do 2x15 on each side.  When you walk: 1) Think "1 one thousand, 2 one thousand" to make sure you are spending the same amount of time on each leg.  2) Bring your knee up instead of having a stiff leg. 3) make sure you step with the heel and roll off the toe quietly.

## 2019-08-24 ENCOUNTER — Other Ambulatory Visit: Payer: Self-pay

## 2019-08-24 ENCOUNTER — Ambulatory Visit: Payer: Medicare HMO

## 2019-08-24 DIAGNOSIS — M62838 Other muscle spasm: Secondary | ICD-10-CM

## 2019-08-24 DIAGNOSIS — R278 Other lack of coordination: Secondary | ICD-10-CM

## 2019-08-24 DIAGNOSIS — M4124 Other idiopathic scoliosis, thoracic region: Secondary | ICD-10-CM

## 2019-08-24 DIAGNOSIS — M533 Sacrococcygeal disorders, not elsewhere classified: Secondary | ICD-10-CM

## 2019-08-24 NOTE — Patient Instructions (Addendum)
  Wear your heel-lift even at home when you are doing chores around the house to minimizre low back pain and help take pressure off of the nerves that go to the pelvic floor.  Look in your phone for the video of the "tea-pot" exercise. Do this with your left hand on the wall, leaning to the right only. Do 2 sets of 15.     Hold for 30 seconds (5 deep breaths) and repeat 2-3 times on each side once a day  Stabilization: Diaphragmatic Breathing    Lie with knees bent, feet flat. Place one hand on stomach, other on chest. Breathe deeply through nose, lifting belly hand without any motion of hand on chest. Practice this for ~ 5 min. Per night, and whenever you can remember throughout the day.   Urge supression technique:   1) Take a deep breath to convince yourself that you are in control and calm the nervous system.  2) Do 5 "quick-flick" kegels (pelvic floor muscle contractions) and re-assess the urge. Repeat another set if urge is still present. 3) Once the urge has decreased, start walking calmly to the the bathroom. Stop and repeat steps 1 and 2 as many times as needed until you can successfully get to the toilet. 4) Only once seated, take a deep breath and allow the pelvic floor muscles to relax and allow for the urine to flow.    Do not be discouraged if you are not successful the first couple times, this is normal and it will take practice but remember that YOU are in control. Start by practicing this at home where you do not have to worry as much if there were to be an accident. Allowing yourself to get rushed or nervous puts the bladder back in control and will not allow the technique to work.   Getting In/out of bed     Lying on back, bend left knee and place left arm across chest. Roll all in one movement to the right. Reverse to roll to the left. Always move as one unit.     Once you are lying on you side, move legs to edge of bed. Pull in the pelvic floor and lower tummy  and push down with both hands while moving legs off bed to reach sitting position.  *Reverse sequence to return to lying down.

## 2019-08-24 NOTE — Therapy (Signed)
[place] MAIN [facility] SERVICES [address] [place], [place], [address] Phone: [phone]   Fax:  [phone]  Physical Therapy Treatment  The patient has been informed of current processes in place at Outpatient Rehab to protect patients from Covid-19 exposure including social distancing, schedule modifications, and new cleaning procedures. After discussing their particular risk with a therapist based on the patient's personal risk factors, the patient has decided to proceed with in-person therapy.   Patient Details  Name: Vanessa Newman MRN: [id number] Date of Birth: 06-27-1940 No data recorded  Encounter Date: 08/24/2019  PT End of Session - 08/24/19 1505    Visit Number  3    Number of Visits  10    Date for PT Re-Evaluation  10/21/19    Authorization Type  Medicare    Authorization Time Period  08/10/19 through 10/21/19    Authorization - Visit Number  3    Progress Note Due on Visit  10    PT Start Time  1400    PT Stop Time  1500    PT Time Calculation (min)  60 min    Activity Tolerance  Patient tolerated treatment well;No increased pain    Behavior During Therapy  WFL for tasks assessed/performed       Past Medical History:  Diagnosis Date  . Acid reflux   . Anxiety and depression   . Cystocele    2nd degree  . Fibroids   . Hypercholesteremia   . Hypertension   . Left lower quadrant pain   . Melanoma ([place])   . Rectocele    2nd degree  . SUI (stress urinary incontinence, female)   . Urinary incontinence   . Vaginal atrophy     Past Surgical History:  Procedure Laterality Date  . right knee replacement Right 2010  . TOTAL HIP ARTHROPLASTY Right 2013    There were no vitals filed for this visit.    Pelvic Floor Physical Therapy Treatment Note  SCREENING  Changes in medications, allergies, or medical history?: quit taking medications for asthma and feels better without the medications. She feels like she is  breathing better and not coughing as much without the medications.    SUBJECTIVE  Patient reports: She is wearing her heel lift ~ 4 hrs. Per day, goes barefoot in the house. She has pain when she stands to do dishes, sweeps or swiffer wet-jets. Leakage is better. Has notices that she is not having leakage when she stands from sitting or bending. Has not had any leakage since last visit. Has really been paying attention when she is walking. Misses her tai-chi. She still says that she knows that when she stands up "its gonna come out" first thing in the morning. That has not changed       Precautions:  Hx of skin CA, lumbar stenosis, exertional dyspnea, H/O chest pain, arthritis, THR, TKE on R. Asthma, Rectocele, Cystocele, Anxiety and Depression, Acid reflux, fibroids, HTN, Plantar fibromatosis.  Sexual activity/pain: No pain  Location of pain: LBP Current pain: 2/10  Max pain: 6/10 Least pain: 0/10 Nature of pain:dull achy  Patient Goals: To not have any bowel or urinary incontinence.   OBJECTIVE  Changes in: Posture/Observations:  Anterior pelvic tilt, RLE long (hip and knee replacements) R thoracic scoliosis, spasms surrounding the pelvis. Abnormal gait. Breathing dysfunction   Range of Motion/Flexibilty:  Decreased PIVM along entire spine other than ~ T7-10 with tenderness. Decreased thoracic rotation ROM B.  Strength/MMT:  LE MMT:  Pelvic floor:  Abdominal:   Palpation:  Gait Analysis: ~75% improved from original gait. Continues to have decreased thoracic rotation with AMB.  INTERVENTIONS THIS SESSION: Manual: Performed TP release to B pectineus and adductor brevis to decrease spasm and pain and allow for improved balance of musculature for improved function and decreased symptoms.  Therex: Reviewed bow-and-arrow and breathing and educated on and practiced side-stretch and tea-pot to the R to improve mobility of joint and surrounding connective tissue and  decrease pressure on nerve roots for improved conductivity and function of down-stream tissues.   Self-care: Educated on and practiced urge-suppression technique and supine-to-long-sit transfer to decrease downward pressure on the bladder in the morning and help the PFM engage to prepare to support a full bladder first thing in the morning to reduce incontinence from urge in the morning.   Total time: 60 min.                           PT Short Term Goals - 08/12/19 6578      PT SHORT TERM GOAL #1   Title  Patient will demonstrate functional recruitment of TA with breathing, sit-to-stand, squatting/lifting, and walking to allow for improved pelvic brace coordination, improved balance, and decreased downward pressure on the pelvic organs.    Baseline  Pt. demonstrates breathing dysfunction and has POP    Time  5    Period  Weeks    Status  New    Target Date  09/16/19      PT SHORT TERM GOAL #2   Title  Patient will demonstrate improved sitting and standing posture to demonstrate learning and decrease stress on the pelvic floor with functional activity.    Baseline  hyper kyphotic/lordotic,    Time  5    Period  Weeks    Status  New    Target Date  09/16/19      PT SHORT TERM GOAL #3   Title  Patient will demonstrate HEP x1 in the clinic to demonstrate understanding and proper form to allow for further improvement.    Baseline  Pt. lacks knowledge of exercises that will help decreased Sx.    Time  5    Period  Weeks    Status  New    Target Date  09/16/19      PT SHORT TERM GOAL #4   Title  Patient will report a reduction in pain to no greater than 5/10 over the prior week to demonstrate symptom improvement.    Baseline  8/10 max LBP    Time  5    Period  Weeks    Status  New    Target Date  09/16/19        PT Long Term Goals - 08/12/19 0942      PT LONG TERM GOAL #1   Title  Patient will demonstrate a coordinated contraction, relaxation, and  bulge of the pelvic floor muscles to demonstrate functional recruitment and motion and allow for further strengthening.    Baseline  Pt. having FI, mixed UI, needing pantyliner during the day, Depends at night.    Time  10    Period  Weeks    Status  New    Target Date  10/21/19      PT LONG TERM GOAL #2   Title  Patient will report no episodes of SUI/UUI over the course of the prior two  weeks to demonstrate improved functional ability.    Baseline  Pt. has leakage with bending, lifting, straining and sometimes has leakage first thing in the morning, always running in the morning.    Time  10    Period  Weeks    Status  New    Target Date  10/21/19      PT LONG TERM GOAL #3   Title  Patient will report no episodes of Fecal Incontinence over the past two weeks to demonstrate improved function and quality of life and to allow her to participate in Tai-Chi and dancing as part of her occupation.    Baseline  Having fecal smearing w/o awareness daily.    Time  10    Period  Weeks    Status  New    Target Date  10/21/19      PT LONG TERM GOAL #4   Title  Patient will describe pain no greater than 3/10 over the past week to demonstrate improved functional ability and increased QOL.    Baseline  8/10 max    Time  10    Period  Weeks    Status  New    Target Date  10/21/19      PT LONG TERM GOAL #5   Title  Pt. will improve in FOTO score by 10 points to demonstrate improved function.    Baseline  FOTO PFDI Bowel: 13, Urinary Problem: 38, Bowel Leakage: 47, PFDI Urinary:  17    Time  10    Period  Weeks    Status  New    Target Date  10/21/19            Plan - 08/24/19 1429    Clinical Impression Statement  Pt. Responded well to all interventions today, demonstrating improved SUI between sessions, improved gait mechanics carry-over from last session, decreased adductor spasm, as well as understanding and correct performance of all education and exercises provided today. They will  continue to benefit from skilled physical therapy to work toward remaining goals and maximize function as well as decrease likelihood of symptom increase or recurrence.     PT Next Visit Plan  assess PFM, give tea-pot leaning to R. thoracic mobilitywith L rotation test-te-test    PT Home Exercise Plan  Sit-to stand, bow-and-arrow, walking mechanics, heel lift in L, side-stretch, bow and arrow, tea-pot.    Consulted and Agree with Plan of Care  Patient       Patient will benefit from skilled therapeutic intervention in order to improve the following deficits and impairments:     Visit Diagnosis: Other idiopathic scoliosis, thoracic region  Sacrococcygeal disorders, not elsewhere classified  Other muscle spasm  Other lack of coordination     Problem List Patient Active Problem List   Diagnosis Date Noted  . History of chest pain 08/04/2018  . Mixed incontinence urge and stress 05/30/2015  . Exertional dyspnea 01/12/2014  . Chest pain 01/12/2014  . Personal history of other malignant neoplasm of skin 09/27/2013  . 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 Synovitis 09/09/2013  . Myofascial muscle pain 09/09/2013  . Lumbar spinal stenosis 09/09/2013  . Hyperlipidemia, unspecified 09/09/2013  . HTN (hypertension) 09/09/2013  . GERD (gastroesophageal reflux disease) 09/09/2013  . Depression 09/09/2013   Willa Rough DPT, ATC Willa Rough 08/24/2019, 3:07 PM  [place] MAIN [facility] SERVICES [address] [place], [place], [address] Phone: [phone]   Fax:  [phone]  Name: Vanessa Newman MRN: [id number] Date of Birth: Sep 12, 1940

## 2019-08-31 ENCOUNTER — Ambulatory Visit: Payer: Medicare HMO

## 2019-08-31 ENCOUNTER — Other Ambulatory Visit: Payer: Self-pay

## 2019-08-31 DIAGNOSIS — M533 Sacrococcygeal disorders, not elsewhere classified: Secondary | ICD-10-CM

## 2019-08-31 DIAGNOSIS — M4124 Other idiopathic scoliosis, thoracic region: Secondary | ICD-10-CM

## 2019-08-31 DIAGNOSIS — M62838 Other muscle spasm: Secondary | ICD-10-CM

## 2019-08-31 DIAGNOSIS — R278 Other lack of coordination: Secondary | ICD-10-CM

## 2019-08-31 NOTE — Therapy (Signed)
[place] MAIN [facility] SERVICES [address] [place], [place], [address] Phone: [phone]   Fax:  [phone]  Physical Therapy Treatment  The patient has been informed of current processes in place at Outpatient Rehab to protect patients from Covid-19 exposure including social distancing, schedule modifications, and new cleaning procedures. After discussing their particular risk with a therapist based on the patient's personal risk factors, the patient has decided to proceed with in-person therapy.   Patient Details  Name: Vanessa Newman MRN: [id number] Date of Birth: [birth year] No data recorded  Encounter Date: 08/31/2019  PT End of Session - 08/31/19 1539    Visit Number  4    Number of Visits  10    Date for PT Re-Evaluation  10/21/19    Authorization Type  Medicare    Authorization Time Period  08/10/19 through 10/21/19    Authorization - Visit Number  4    Authorization - Number of Visits  10    Progress Note Due on Visit  10    PT Start Time  0930    PT Stop Time  1030    PT Time Calculation (min)  60 min    Activity Tolerance  Patient tolerated treatment well;No increased pain    Behavior During Therapy  WFL for tasks assessed/performed       Past Medical History:  Diagnosis Date  . Acid reflux   . Anxiety and depression   . Cystocele    2nd degree  . Fibroids   . Hypercholesteremia   . Hypertension   . Left lower quadrant pain   . Melanoma ([place])   . Rectocele    2nd degree  . SUI (stress urinary incontinence, female)   . Urinary incontinence   . Vaginal atrophy     Past Surgical History:  Procedure Laterality Date  . right knee replacement Right 2010  . TOTAL HIP ARTHROPLASTY Right 2013    There were no vitals filed for this visit.   Pelvic Floor Physical Therapy Treatment Note  SCREENING  Changes in medications, allergies, or medical history?: Started taking her inhaler again once she realized it was getting  worse without it. She still does not like that it could make her bones weaker.   SUBJECTIVE  Patient reports: Has been doing good when she does what she is supposed to do. "I do stupid things sometimes" Has remembered to do her exercises before getting out of bed and she is better. Has leakage "sometimes" but not like she was. She has been able to use kegels to help prevent leakage with bending. Can tell the days that she forgets to do her exercises in the morning is the days that she still has leakage in the morning.  She has some pain in the mid/upper back when sweeping, swiffer-wet-jetting, or washing dishes.       Had surgery for her scoliosis "a long time ago" she had it done at [facility], "breezed right through it"  Picked up some small things in the yard so her husband could mow.  Precautions:  Hx of skin CA, lumbar stenosis, exertional dyspnea, H/O chest pain, arthritis, THR, TKE on R. Asthma, Rectocele, Cystocele, Anxiety and Depression, Acid reflux, fibroids, HTN, Plantar fibromatosis.  Sexual activity/pain: No pain  Location of pain: upper/mid-back Current pain: 4/10  Max pain: 8/10 Least pain: 0/10 Nature of pain:dull achy  **pain is 3/10 following treatment  Patient Goals: To not have any bowel or urinary  incontinence.   OBJECTIVE  Changes in: Posture/Observations:  Anterior pelvic tilt, RLE long (hip and knee replacements) R thoracic scoliosis, spasms surrounding the pelvis. Abnormal gait. Breathing dysfunction (from previous session)  Range of Motion/Flexibilty:  Decreased PIVM along entire spine other than ~ T7-10 with tenderness. Decreased thoracic rotation ROM B.   -Improved following treatment   Strength/MMT:  LE MMT:  Pelvic floor:  Abdominal:  Pt. Has difficulty recruiting TA or obliques, poor coordination surrounding pelvis and abdomen.   -following treatment, Pt. Was able to coordinate exhale with TA then oblique recruitment on  exhale.  Palpation: TTP to lumbar multifidus and erector spinae.  Gait Analysis: ~75% improved from original gait. Continues to have decreased thoracic rotation with AMB.  INTERVENTIONS THIS SESSION: Manual: Performed  Grade 2-3 PA mobs along entire thoracic spine, lumbar spine and performed TP release to erector spinae and multifidus through the lumbar spine to improve spinal alignment and decrease pressure on nerve roots to allow for improved proprioception and coordination.   Nm re-ed: Educated on and practiced coordination of pelvic tilt and diaphragmatic breathing. Used PNF with diaphragmatic breathing to improve motion/recruitment and decrease rib-flare for improved deep-core control to decrease SUI.     Total time: 60 min.                             PT Short Term Goals - 08/12/19 7858      PT SHORT TERM GOAL #1   Title  Patient will demonstrate functional recruitment of TA with breathing, sit-to-stand, squatting/lifting, and walking to allow for improved pelvic brace coordination, improved balance, and decreased downward pressure on the pelvic organs.    Baseline  Pt. demonstrates breathing dysfunction and has POP    Time  5    Period  Weeks    Status  New    Target Date  09/16/19      PT SHORT TERM GOAL #2   Title  Patient will demonstrate improved sitting and standing posture to demonstrate learning and decrease stress on the pelvic floor with functional activity.    Baseline  hyper kyphotic/lordotic,    Time  5    Period  Weeks    Status  New    Target Date  09/16/19      PT SHORT TERM GOAL #3   Title  Patient will demonstrate HEP x1 in the clinic to demonstrate understanding and proper form to allow for further improvement.    Baseline  Pt. lacks knowledge of exercises that will help decreased Sx.    Time  5    Period  Weeks    Status  New    Target Date  09/16/19      PT SHORT TERM GOAL #4   Title  Patient will report a reduction in  pain to no greater than 5/10 over the prior week to demonstrate symptom improvement.    Baseline  8/10 max LBP    Time  5    Period  Weeks    Status  New    Target Date  09/16/19        PT Long Term Goals - 08/12/19 0942      PT LONG TERM GOAL #1   Title  Patient will demonstrate a coordinated contraction, relaxation, and bulge of the pelvic floor muscles to demonstrate functional recruitment and motion and allow for further strengthening.    Baseline  Pt. having FI, mixed  UI, needing pantyliner during the day, Depends at night.    Time  10    Period  Weeks    Status  New    Target Date  10/21/19      PT LONG TERM GOAL #2   Title  Patient will report no episodes of SUI/UUI over the course of the prior two weeks to demonstrate improved functional ability.    Baseline  Pt. has leakage with bending, lifting, straining and sometimes has leakage first thing in the morning, always running in the morning.    Time  10    Period  Weeks    Status  New    Target Date  10/21/19      PT LONG TERM GOAL #3   Title  Patient will report no episodes of Fecal Incontinence over the past two weeks to demonstrate improved function and quality of life and to allow her to participate in Tai-Chi and dancing as part of her occupation.    Baseline  Having fecal smearing w/o awareness daily.    Time  10    Period  Weeks    Status  New    Target Date  10/21/19      PT LONG TERM GOAL #4   Title  Patient will describe pain no greater than 3/10 over the past week to demonstrate improved functional ability and increased QOL.    Baseline  8/10 max    Time  10    Period  Weeks    Status  New    Target Date  10/21/19      PT LONG TERM GOAL #5   Title  Pt. will improve in FOTO score by 10 points to demonstrate improved function.    Baseline  FOTO PFDI Bowel: 13, Urinary Problem: 38, Bowel Leakage: 47, PFDI Urinary:  17    Time  10    Period  Weeks    Status  New    Target Date  10/21/19             Plan - 08/31/19 1540    Clinical Impression Statement  Pt. Responded well to all interventions today, demonstrating improved deep-core coordination and recruitment, decreased pain, improved mobility, as well as understanding and correct performance of all education and exercises provided today. They will continue to benefit from skilled physical therapy to work toward remaining goals and maximize function as well as decrease likelihood of symptom increase or recurrence.     PT Next Visit Plan  build on breathing coordination to re-add pelvic tilt, kegel, and lengthening. assess PFM, give tea-pot leaning to R. thoracic mobilitywith L rotation test-te-test    PT Home Exercise Plan  Sit-to stand, bow-and-arrow, walking mechanics, heel lift in L, side-stretch, bow and arrow, tea-pot, coordination with TA/Obliques while exhaling.    Consulted and Agree with Plan of Care  Patient       Patient will benefit from skilled therapeutic intervention in order to improve the following deficits and impairments:     Visit Diagnosis: Other idiopathic scoliosis, thoracic region  Sacrococcygeal disorders, not elsewhere classified  Other muscle spasm  Other lack of coordination     Problem List Patient Active Problem List   Diagnosis Date Noted  . History of chest pain 08/04/2018  . Mixed incontinence urge and stress 05/30/2015  . Exertional dyspnea 01/12/2014  . Chest pain 01/12/2014  . Personal history of other malignant neoplasm of skin 09/27/2013  . 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  Synovitis 09/09/2013  . Myofascial muscle pain 09/09/2013  . Lumbar spinal stenosis 09/09/2013  . Hyperlipidemia, unspecified 09/09/2013  . HTN (hypertension) 09/09/2013  . GERD (gastroesophageal reflux disease) 09/09/2013  . Depression 09/09/2013   Willa Rough DPT, ATC Willa Rough 08/31/2019, 3:41 PM  [place] MAIN [facility] SERVICES [address]  [place], [place], [address] Phone: [phone]   Fax:  [phone]  Name: MIJA EFFERTZ MRN: [id number] Date of Birth: June 17, 1940

## 2019-08-31 NOTE — Patient Instructions (Addendum)
As you start wearing your heel-lift only wear it for an hour the first day and increase by an hour each day so you can allow for the body to adapt to the change easily without much pain. If your pain increases by more than 1-2 points, back off slightly or slow down how quickly you increase your wear time. Once you reach a full day of wear, use it as much as possible forever, even in house-shoes or flip-flops if necessary to keep yourself from reverting to bad pelvic and spinal alignment and having symptoms return.    Adjust-a-lift heel lift Can be found at [email or web address]  Stabilization: Diaphragmatic Breathing    Lie with knees bent, feet flat. Place one hand on stomach, other on chest. Breathe deeply through nose, lifting belly hand without any motion of hand on chest. When you breathe out, feel the low belly muscles squeeze first, then pull the rib cage closer together to push the last bit of air out.  Repeat _10___ times per set. Do __3__ sets per session. Do __7__ sessions per week.

## 2019-09-07 ENCOUNTER — Other Ambulatory Visit: Payer: Self-pay

## 2019-09-07 ENCOUNTER — Ambulatory Visit: Payer: Medicare HMO

## 2019-09-07 DIAGNOSIS — M533 Sacrococcygeal disorders, not elsewhere classified: Secondary | ICD-10-CM

## 2019-09-07 DIAGNOSIS — R278 Other lack of coordination: Secondary | ICD-10-CM

## 2019-09-07 DIAGNOSIS — M4124 Other idiopathic scoliosis, thoracic region: Secondary | ICD-10-CM | POA: Diagnosis not present

## 2019-09-07 DIAGNOSIS — M62838 Other muscle spasm: Secondary | ICD-10-CM

## 2019-09-07 NOTE — Patient Instructions (Addendum)
  FIRST THING IN THE MORNING: Switch from "no no no" to "I can handle this, I am in control"   Remember to "breath out" strong like you are blowing out all your birthday candles as you go from sitting to standing.    When seated, you want to maintain pelvic neutral with the shoulders gently down and back and ears in line with your shoulders. A lumbar roll such as the one below or a home-made towel-roll can be used for this purpose. Even Olympic athletes can only maintain proper seated posture for about 10 minutes without support!    Pictured: The Original [employer] Lumbar Roll  Also make sure that your knees are at hip-height if you are going to be sitting for longer than ~20 min.     Do 2 sets of 15 tilts per day. Breathe in when you tilt forward (A) and out when you tuck under (B).

## 2019-09-07 NOTE — Therapy (Addendum)
[place] MAIN [facility] SERVICES [address] [place], [place], [address] Phone: [phone]   Fax:  [phone]  Physical Therapy Treatment  The patient has been informed of current processes in place at Outpatient Rehab to protect patients from Covid-19 exposure including social distancing, schedule modifications, and new cleaning procedures. After discussing their particular risk with a therapist based on the patient's personal risk factors, the patient has decided to proceed with in-person therapy.   Patient Details  Name: Vanessa Newman MRN: [id number] Date of Birth: 28-Dec-1940 No data recorded  Encounter Date: 09/07/2019  PT End of Session - 09/14/19 0853    Visit Number  5    Number of Visits  10    Date for PT Re-Evaluation  10/21/19    Authorization Type  Medicare    Authorization Time Period  08/10/19 through 10/21/19    Authorization - Visit Number  5    Authorization - Number of Visits  10    Progress Note Due on Visit  10    PT Start Time  1400    PT Stop Time  1500    PT Time Calculation (min)  60 min    Activity Tolerance  Patient tolerated treatment well;No increased pain    Behavior During Therapy  WFL for tasks assessed/performed       Past Medical History:  Diagnosis Date  . Acid reflux   . Anxiety and depression   . Cystocele    2nd degree  . Fibroids   . Hypercholesteremia   . Hypertension   . Left lower quadrant pain   . Melanoma ([place])   . Rectocele    2nd degree  . SUI (stress urinary incontinence, female)   . Urinary incontinence   . Vaginal atrophy     Past Surgical History:  Procedure Laterality Date  . right knee replacement Right 2010  . TOTAL HIP ARTHROPLASTY Right 2013    There were no vitals filed for this visit.   Pelvic Floor Physical Therapy Treatment Note  SCREENING  Changes in medications, allergies, or medical history?: Started taking her inhaler again once she realized it was getting  worse without it. She still does not like that it could make her bones weaker.   SUBJECTIVE  Patient reports: She feels pretty good. No bowel issues. Has still had leakage first thing in the morning which is less likely if she remembers to do breathing/urge supperssingor once in a while if she bends forward to pick something up, though it is just a little bit. Back seemed to be irritated yesterday. She rode in the car 1.5 hrs. Each way.  Precautions:  Hx of skin CA, lumbar stenosis, exertional dyspnea, H/O chest pain, arthritis, THR, TKE on R. Asthma, Rectocele, Cystocele, Anxiety and Depression, Acid reflux, fibroids, HTN, Plantar fibromatosis.  Sexual activity/pain: No pain  Location of pain: low back Current pain: 2/10  Max pain: 8/10 Least pain: 0/10 Nature of pain:dull achy   Patient Goals: To not have any bowel or urinary incontinence.   OBJECTIVE  Changes in: Posture/Observations:  Anterior pelvic tilt, RLE long (hip and knee replacements) R thoracic scoliosis, spasms surrounding the pelvis. Abnormal gait. Breathing dysfunction (from previous session)  Range of Motion/Flexibilty:   Strength/MMT:  LE MMT:  Pelvic floor:  Abdominal:  Pt. Still has some difficulty with coordination/timing of deep core but is at least 50% accurate w/o cueing.  Palpation: TTP to lumbar multifidus and erector spinae. (from  prior session)  Gait Analysis: ~75% improved from original gait. Continues to have decreased thoracic rotation with AMB.  INTERVENTIONS THIS SESSION: self-care: reviewed sit-to stand and exhale with exertion, discussed timing of exhale to help with leakage first thing in the morning during transition from bed to decrease mixed UI. educated on seated posture to minimize pain during long sitting/roadtrips with use of lumbar roll and adequate foot support  Therex: Educated on and practiced seated pelvic tilts and reviewed and practiced breathing to further  improve coordination of the deep core along with superficial muscles acting on the pelvis.    Total time: 60 min.                            PT Short Term Goals - 08/12/19 6599      PT SHORT TERM GOAL #1   Title  Patient will demonstrate functional recruitment of TA with breathing, sit-to-stand, squatting/lifting, and walking to allow for improved pelvic brace coordination, improved balance, and decreased downward pressure on the pelvic organs.    Baseline  Pt. demonstrates breathing dysfunction and has POP    Time  5    Period  Weeks    Status  New    Target Date  09/16/19      PT SHORT TERM GOAL #2   Title  Patient will demonstrate improved sitting and standing posture to demonstrate learning and decrease stress on the pelvic floor with functional activity.    Baseline  hyper kyphotic/lordotic,    Time  5    Period  Weeks    Status  New    Target Date  09/16/19      PT SHORT TERM GOAL #3   Title  Patient will demonstrate HEP x1 in the clinic to demonstrate understanding and proper form to allow for further improvement.    Baseline  Pt. lacks knowledge of exercises that will help decreased Sx.    Time  5    Period  Weeks    Status  New    Target Date  09/16/19      PT SHORT TERM GOAL #4   Title  Patient will report a reduction in pain to no greater than 5/10 over the prior week to demonstrate symptom improvement.    Baseline  8/10 max LBP    Time  5    Period  Weeks    Status  New    Target Date  09/16/19        PT Long Term Goals - 08/12/19 0942      PT LONG TERM GOAL #1   Title  Patient will demonstrate a coordinated contraction, relaxation, and bulge of the pelvic floor muscles to demonstrate functional recruitment and motion and allow for further strengthening.    Baseline  Pt. having FI, mixed UI, needing pantyliner during the day, Depends at night.    Time  10    Period  Weeks    Status  New    Target Date  10/21/19      PT LONG  TERM GOAL #2   Title  Patient will report no episodes of SUI/UUI over the course of the prior two weeks to demonstrate improved functional ability.    Baseline  Pt. has leakage with bending, lifting, straining and sometimes has leakage first thing in the morning, always running in the morning.    Time  10    Period  Weeks  Status  New    Target Date  10/21/19      PT LONG TERM GOAL #3   Title  Patient will report no episodes of Fecal Incontinence over the past two weeks to demonstrate improved function and quality of life and to allow her to participate in Tai-Chi and dancing as part of her occupation.    Baseline  Having fecal smearing w/o awareness daily.    Time  10    Period  Weeks    Status  New    Target Date  10/21/19      PT LONG TERM GOAL #4   Title  Patient will describe pain no greater than 3/10 over the past week to demonstrate improved functional ability and increased QOL.    Baseline  8/10 max    Time  10    Period  Weeks    Status  New    Target Date  10/21/19      PT LONG TERM GOAL #5   Title  Pt. will improve in FOTO score by 10 points to demonstrate improved function.    Baseline  FOTO PFDI Bowel: 13, Urinary Problem: 38, Bowel Leakage: 47, PFDI Urinary:  17    Time  10    Period  Weeks    Status  New    Target Date  10/21/19            Plan - 09/14/19 0854    Clinical Impression Statement  Pt. Responded well to all interventions today, demonstrating improved deep-core coordination and recruitment as well as understanding and correct performance of all education and exercises provided today. They will continue to benefit from skilled physical therapy to work toward remaining goals and maximize function as well as decrease likelihood of symptom increase or recurrence.     PT Next Visit Plan  build on breathing coordination to re-add pelvic tilt, kegel, and lengthening. assess PFM, review tea-pot leaning to R. thoracic mobilitywith L rotation test-te-test     PT Home Exercise Plan  Sit-to stand, bow-and-arrow, walking mechanics, heel lift in L, side-stretch, bow and arrow, tea-pot, coordination with TA/Obliques while exhaling, seated pelvic tilt.    Consulted and Agree with Plan of Care  Patient       Patient will benefit from skilled therapeutic intervention in order to improve the following deficits and impairments:     Visit Diagnosis: Other idiopathic scoliosis, thoracic region  Sacrococcygeal disorders, not elsewhere classified  Other muscle spasm  Other lack of coordination     Problem List Patient Active Problem List   Diagnosis Date Noted  . History of chest pain 08/04/2018  . Mixed incontinence urge and stress 05/30/2015  . Exertional dyspnea 01/12/2014  . Chest pain 01/12/2014  . Personal history of other malignant neoplasm of skin 09/27/2013  . 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 Synovitis 09/09/2013  . Myofascial muscle pain 09/09/2013  . Lumbar spinal stenosis 09/09/2013  . Hyperlipidemia, unspecified 09/09/2013  . HTN (hypertension) 09/09/2013  . GERD (gastroesophageal reflux disease) 09/09/2013  . Depression 09/09/2013   Willa Rough DPT, ATC Willa Rough 09/14/2019, 8:58 AM  [place] MAIN [facility] SERVICES [address] [place], [place], [address] Phone: [phone]   Fax:  [phone]  Name: NEALIE MCHATTON MRN: [id number] Date of Birth: February 17, 1941

## 2019-09-14 ENCOUNTER — Ambulatory Visit: Payer: Medicare HMO

## 2019-09-21 ENCOUNTER — Ambulatory Visit: Payer: Medicare HMO

## 2019-09-23 ENCOUNTER — Ambulatory Visit: Payer: Medicare HMO | Attending: Student

## 2019-09-28 ENCOUNTER — Ambulatory Visit: Payer: Medicare HMO

## 2019-10-05 ENCOUNTER — Ambulatory Visit: Payer: Medicare HMO

## 2019-10-12 ENCOUNTER — Ambulatory Visit: Payer: Medicare HMO | Attending: Student

## 2019-10-19 ENCOUNTER — Other Ambulatory Visit: Payer: Self-pay | Admitting: Family Medicine

## 2019-10-19 DIAGNOSIS — Z1231 Encounter for screening mammogram for malignant neoplasm of breast: Secondary | ICD-10-CM

## 2019-10-21 ENCOUNTER — Ambulatory Visit: Payer: Medicare HMO

## 2019-10-21 DIAGNOSIS — R278 Other lack of coordination: Secondary | ICD-10-CM

## 2019-10-21 DIAGNOSIS — M4124 Other idiopathic scoliosis, thoracic region: Secondary | ICD-10-CM

## 2019-10-21 DIAGNOSIS — M533 Sacrococcygeal disorders, not elsewhere classified: Secondary | ICD-10-CM

## 2019-10-21 DIAGNOSIS — M62838 Other muscle spasm: Secondary | ICD-10-CM

## 2019-10-21 DIAGNOSIS — R2681 Unsteadiness on feet: Secondary | ICD-10-CM

## 2019-10-21 DIAGNOSIS — R2689 Other abnormalities of gait and mobility: Secondary | ICD-10-CM

## 2019-10-21 NOTE — Therapy (Unsigned)
[place] MAIN [facility] SERVICES [address] [place], [place], [address] Phone: [phone]   Fax:  [phone]  Oct 21, 2019   No Recipients  Physical Therapy Discharge Summary  Patient: TONESHIA COELLO  MRN: [id number]  Date of Birth: [birth year]   Diagnosis: Other idiopathic scoliosis, thoracic region  Sacrococcygeal disorders, not elsewhere classified  Other muscle spasm  Other lack of coordination  Unsteadiness on feet  Other abnormalities of gait and mobility No data recorded  The above patient had been seen in Physical Therapy 5 times of 5 treatments scheduled with 5 no shows and 3 cancellations.  The treatment consisted of Manual therapy, neuromuscular re-education, self-care education, therapeutic exercise, and gait-training.  The patient is: Improved  Subjective: At last visit Pt. Stated that her bowel issues were much improved and that she was only having FI first-thing in the morning. Her back was doing well other than becoming elevated when she sat for ~ 3 hours in the car one day.  Discharge Findings: Pt. Made great progress over her first 5 visits. She was expected to continue to improve but she began cancelling appointments and no-showing all the way through the last half of her certification time-period so we will D/C today with unclear rationale about why she did not complete her course of therapy regardless of seeming approval at her progress.  Functional Status at Discharge: Independent  Goals Partially Met    Sincerely,  Willa Rough DPT, ATC Willa Rough, PT   CC No Recipients  [place] MAIN [facility] SERVICES [address] [place], [place], [address] Phone: [phone]   Fax:  [phone]  Patient: HERBERTA PICKRON  MRN: [id number]  Date of Birth: Jun 05, 1941

## 2019-11-24 ENCOUNTER — Other Ambulatory Visit: Payer: Self-pay | Admitting: Family Medicine

## 2019-11-24 DIAGNOSIS — M8580 Other specified disorders of bone density and structure, unspecified site: Secondary | ICD-10-CM

## 2019-11-24 DIAGNOSIS — E559 Vitamin D deficiency, unspecified: Secondary | ICD-10-CM

## 2019-11-25 ENCOUNTER — Ambulatory Visit
Admission: RE | Admit: 2019-11-25 | Discharge: 2019-11-25 | Disposition: A | Discharge status: Home / Self Care | Payer: Medicare HMO | Source: Ambulatory Visit | Attending: Family Medicine | Admitting: Family Medicine

## 2019-11-25 DIAGNOSIS — Z1231 Encounter for screening mammogram for malignant neoplasm of breast: Secondary | ICD-10-CM | POA: Diagnosis present

## 2019-12-02 ENCOUNTER — Ambulatory Visit
Admission: RE | Admit: 2019-12-02 | Discharge: 2019-12-02 | Disposition: A | Discharge status: Home / Self Care | Payer: Medicare HMO | Source: Ambulatory Visit | Attending: Family Medicine | Admitting: Family Medicine

## 2019-12-02 DIAGNOSIS — E559 Vitamin D deficiency, unspecified: Secondary | ICD-10-CM | POA: Diagnosis not present

## 2019-12-02 DIAGNOSIS — M8589 Other specified disorders of bone density and structure, multiple sites: Secondary | ICD-10-CM | POA: Insufficient documentation

## 2019-12-02 DIAGNOSIS — M8580 Other specified disorders of bone density and structure, unspecified site: Secondary | ICD-10-CM

## 2020-04-07 ENCOUNTER — Telehealth: Payer: Self-pay | Admitting: Pulmonary Disease

## 2020-04-07 NOTE — Telephone Encounter (Signed)
I do not see mentioning of PFT within last OV note. Per 05/25/2019 note, patient was to return in 3-4 weeks and PFT would be ordered then.  She called in on 06/24/2019 to schedule PFT, however PFT's were on hold due to covid.   Dr. Patsey Berthold, patient is scheduled for OV on 04/13/2020. Would you like to postpone appt until after PFT?

## 2020-04-07 NOTE — Telephone Encounter (Signed)
Patient is aware of below message and voiced her understanding.  Nothing further needed.   

## 2020-04-07 NOTE — Telephone Encounter (Signed)
We had not ordered PFTs have respiratory status and she was given Adair Patter to try to compensate her better.  Let us see her and then we can schedule her once I evaluate her.

## 2020-04-07 NOTE — Telephone Encounter (Signed)
Dr. Patsey Berthold saw this patient on 06/30/2019 and stated for her to return in 2 months. There were no orders placed not even PFT order please review

## 2020-04-13 ENCOUNTER — Other Ambulatory Visit: Payer: Self-pay

## 2020-04-13 ENCOUNTER — Encounter: Payer: Self-pay | Admitting: Pulmonary Disease

## 2020-04-13 ENCOUNTER — Ambulatory Visit: Payer: Medicare HMO | Admitting: Pulmonary Disease

## 2020-04-13 VITALS — BP 126/84 | HR 69 | Temp 97.1°F | Ht 63.0 in | Wt 203.6 lb

## 2020-04-13 DIAGNOSIS — J45901 Unspecified asthma with (acute) exacerbation: Secondary | ICD-10-CM

## 2020-04-13 DIAGNOSIS — R0602 Shortness of breath: Secondary | ICD-10-CM

## 2020-04-13 DIAGNOSIS — Z9119 Patient's noncompliance with other medical treatment and regimen: Secondary | ICD-10-CM

## 2020-04-13 DIAGNOSIS — Z91199 Patient's noncompliance with other medical treatment and regimen due to unspecified reason: Secondary | ICD-10-CM

## 2020-04-13 MED ORDER — AZITHROMYCIN 250 MG PO TABS: ORAL_TABLET | ORAL | 0 refills | Status: AC

## 2020-04-13 MED ORDER — MONTELUKAST SODIUM 10 MG PO TABS: 10.0000 mg | ORAL_TABLET | Freq: Every day | ORAL | 3 refills | Status: DC

## 2020-04-13 NOTE — Patient Instructions (Signed)
We are giving you an antibiotic that will act to not only as an antibiotic but also a sign anti-inflammatory this is Azithromycin.  Take the medication as instructed in the package.  Continue taking your Breo.  We are adding montelukast (Singulair) to see if this controls your asthmatic symptoms better.   We are getting breathing tests as well as an echocardiogram to evaluate your heart.   We will see you in follow-up in 6 to 8 weeks time call sooner should any new problems arise.

## 2020-04-21 ENCOUNTER — Other Ambulatory Visit: Payer: Self-pay

## 2020-04-21 ENCOUNTER — Ambulatory Visit (INDEPENDENT_AMBULATORY_CARE_PROVIDER_SITE_OTHER): Payer: Medicare HMO

## 2020-04-21 DIAGNOSIS — R0602 Shortness of breath: Secondary | ICD-10-CM | POA: Diagnosis not present

## 2020-04-22 LAB — ECHOCARDIOGRAM COMPLETE
AR max vel: 1.89 cm2
AV Area VTI: 1.88 cm2
AV Area mean vel: 2.04 cm2
AV Mean grad: 6 mmHg
AV Peak grad: 10.9 mmHg
Ao pk vel: 1.65 m/s
Area-P 1/2: 2.8 cm2
Calc EF: 60.7 %
S' Lateral: 3 cm
Single Plane A2C EF: 59.9 %
Single Plane A4C EF: 55.4 %

## 2020-04-24 ENCOUNTER — Telehealth: Payer: Self-pay | Admitting: Pulmonary Disease

## 2020-04-25 NOTE — Telephone Encounter (Signed)
Left side of her heart is stiff. We see this with age. Key is to keep blood pressure controlled. 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 Otherwise very good test with good heart function.  Patient is aware of results and voiced her understanding.    Patient reports of worsening cough, wheezing and increased sob x4mo. Cough is non productive. She completed course of azithromycin that was prescribed on 04/13/2020 with no improvement in sx.  She is taking Breo daily and proair QID.  Denied fever, chills or sweats Not taking any OTC meds to help with sx. She has had both covid vaccines and flu shot.  PCP prescribed prednisone about three weeks ago. She does not wish to take this medication again, as she had suicidal thoughts   Dr. Patsey Berthold, please advise. Thanks

## 2020-04-25 NOTE — Telephone Encounter (Signed)
Lm for patient.  

## 2020-04-25 NOTE — Telephone Encounter (Signed)
The pulmonary function testing ordered will help [place] determine how best to treat her.  However we can start by changing her to Trelegy, 200 strength.  Reluctance with regards to the steroids p.o.  We can also send her some Tessalon Perles to see if this helps with the cough.  200 mg 3 times daily as needed.

## 2020-04-25 NOTE — Telephone Encounter (Signed)
Lm x2 for patient.   

## 2020-04-26 MED ORDER — TRELEGY ELLIPTA 200-62.5-25 MCG/INH IN AEPB: 1.0000 | INHALATION_SPRAY | Freq: Every day | RESPIRATORY_TRACT | 1 refills | Status: DC

## 2020-04-26 MED ORDER — BENZONATATE 200 MG PO CAPS: 200.0000 mg | ORAL_CAPSULE | Freq: Three times a day (TID) | ORAL | 1 refills | Status: DC | PRN

## 2020-04-26 MED ORDER — TRELEGY ELLIPTA 100-62.5-25 MCG/INH IN AEPB: 1.0000 | INHALATION_SPRAY | Freq: Every day | RESPIRATORY_TRACT | 1 refills | Status: DC

## 2020-04-26 NOTE — Telephone Encounter (Signed)
Patient is aware of below recommendations and voiced her understanding.  She is agreeable with switching to trelegy and starting tessalon. She is aware to NOT take Breo while taking Trelegy.  Rx for tessalon and Trelegy has been sen to [place] per patient request. Nothing further needed.

## 2020-05-23 ENCOUNTER — Encounter: Payer: Self-pay | Admitting: Pulmonary Disease

## 2020-05-23 ENCOUNTER — Other Ambulatory Visit: Payer: Self-pay

## 2020-05-23 ENCOUNTER — Ambulatory Visit (INDEPENDENT_AMBULATORY_CARE_PROVIDER_SITE_OTHER): Payer: Medicare HMO | Admitting: Pulmonary Disease

## 2020-05-23 VITALS — BP 132/76 | HR 79 | Temp 97.3°F | Ht 64.0 in | Wt 205.0 lb

## 2020-05-23 DIAGNOSIS — J45998 Other asthma: Secondary | ICD-10-CM

## 2020-05-23 DIAGNOSIS — K219 Gastro-esophageal reflux disease without esophagitis: Secondary | ICD-10-CM

## 2020-05-23 DIAGNOSIS — R059 Cough, unspecified: Secondary | ICD-10-CM

## 2020-05-23 DIAGNOSIS — Z91148 Patient's other noncompliance with medication regimen for other reason: Secondary | ICD-10-CM

## 2020-05-23 DIAGNOSIS — Z9114 Patient's other noncompliance with medication regimen: Secondary | ICD-10-CM

## 2020-05-23 DIAGNOSIS — R0602 Shortness of breath: Secondary | ICD-10-CM | POA: Diagnosis not present

## 2020-05-23 MED ORDER — BUDESONIDE 0.25 MG/2ML IN SUSP: 0.2500 mg | Freq: Two times a day (BID) | RESPIRATORY_TRACT | 11 refills | Status: DC

## 2020-05-23 MED ORDER — ARFORMOTEROL TARTRATE 15 MCG/2ML IN NEBU: 15.0000 ug | INHALATION_SOLUTION | Freq: Two times a day (BID) | RESPIRATORY_TRACT | 11 refills | Status: DC

## 2020-05-23 NOTE — Progress Notes (Signed)
Subjective:    Patient ID: Vanessa Newman, female    DOB: 11-Mar-1941, 79 y.o.   MRN: [id number]  HPI 79 year old lifelong never smoker who presents for follow-up on the issue of persistent asthma of undetermined severity. In January 2021, that time was instructed to continue [facility] which she was not using consistently. She was also get pulmonary function testing scheduled. These were scheduled on initial presentation however the patient did not follow through. She presents today stating that she is "not pleased" with her care. She quit using Breo, admits that she was not compliant with it to start with. She has a brand-new Breo inhaler that has not been touched. Continues to complain of wheezing and dry nonproductive cough and dyspnea. Continues to have issues with nasal congestion and reflux. She continues to use PPI as needed only. She states that Breo "did not do anything for her" however admits that she did not use it consistently. She also cites high cost. She never notified us of these issues so that we could have provided alternative. As noted she has not had her pulmonary function testing which would help [place] better diagnose her. She states that she cannot take prednisone because it "makes her crazy". Recall she had all, RAST and IgE on 05/25/2019. She had a normal IgE, normal RAST. CBC showed only very mild eosinophilia. Working diagnosis so far has been asthma/asthmatic bronchitis however it was stressed to her that we do need pulmonary function testing to better determine diagnosis and treatment.   Review of Systems  A 10 point review of systems was performed and it is as noted above otherwise negative.  Patient Active Problem List   Diagnosis Date Noted  . History of chest pain 08/04/2018  . Mixed incontinence urge and stress 05/30/2015  . Exertional dyspnea 01/12/2014  . Chest pain 01/12/2014  . Personal history of other malignant neoplasm of skin 09/27/2013  . 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 Synovitis 09/09/2013  . Myofascial muscle pain 09/09/2013  . Lumbar spinal stenosis 09/09/2013  . Hyperlipidemia, unspecified 09/09/2013  . HTN (hypertension) 09/09/2013  . GERD (gastroesophageal reflux disease) 09/09/2013  . Depression 09/09/2013   No Known Allergies Current Meds  Medication Sig  . aspirin 81 MG tablet Take 81 mg by mouth daily.  Marland Kitchen atorvastatin (LIPITOR) 40 MG tablet Take 40 mg by mouth daily.  . calcium carbonate (OS-CAL) 600 MG TABS tablet Take 600 mg by mouth daily with breakfast.  . citalopram (CELEXA) 20 MG tablet Take 20 mg by mouth daily.   Marland Kitchen estrogens, conjugated, (PREMARIN) 0.625 MG tablet Take 0.625 mg by mouth daily. Take daily for 21 days then do not take for 7 days.  Marland Kitchen gabapentin (NEURONTIN) 800 MG tablet Take 800 mg by mouth 3 (three) times daily.   Marland Kitchen HYDROcodone-acetaminophen (NORCO/VICODIN) 5-325 MG tablet Take 1 tablet by mouth every 6 (six) hours as needed for moderate pain.  Marland Kitchen ibuprofen (ADVIL,MOTRIN) 600 MG tablet Take 600 mg by mouth every 8 (eight) hours as needed.  . Multiple Vitamins-Minerals (EYE VITAMINS & MINERALS PO) Take by mouth.  Marland Kitchen omeprazole (PRILOSEC) 20 MG capsule Take 20 mg by mouth daily.  Marland Kitchen senna (SENOKOT) 8.6 MG TABS tablet Take 1 tablet by mouth.  . Spacer/Aero-Holding Chambers (AEROCHAMBER MV) inhaler Use as instructed  . triamterene-hydrochlorothiazide (MAXZIDE-25) 37.5-25 MG per tablet Take 1 tablet by mouth daily.  . Vitamin D, Cholecalciferol, 400 UNITS CHEW Chew 400 Units by mouth daily.  Immunization History  Administered Date(s) Administered  . Influenza, High Dose Seasonal PF 03/10/2017, 01/28/2019  . Influenza-Unspecified 03/09/2020  . PFIZER SARS-COV-2 Vaccination 07/06/2019, 07/27/2019, 03/09/2020  . Zoster Recombinat (Shingrix) 12/16/2018, 02/16/2019      Objective:   Physical Exam BP 132/76 (BP Location: Left Arm, Cuff Size: Normal)   Pulse 79   Temp (!) 97.3 F (36.3 C)    Ht 5\' 4"  (1.626 m)   Wt 205 lb (93 kg)   SpO2 98%   BMI 35.19 kg/m  GENERAL: Woman, in no acute distress, fully ambulatory. No conversational dyspnea. HEAD: Normocephalic, atraumatic.  EYES: Pupils equal, round, reactive to light.  No scleral icterus.  MOUTH: Nose/mouth/throat not examined due to masking requirements for COVID 19. NECK: Supple. No thyromegaly. Trachea midline. No JVD.  No adenopathy. PULMONARY: Good air entry bilaterally. Diffuse scattered wheezes and rhonchi. No rales. CARDIOVASCULAR: S1 and S2. Regular rate and rhythm. No rubs, murmurs or gallops heard. ABDOMEN: Obese, otherwise benign. MUSCULOSKELETAL: No joint deformity, no clubbing, no edema.  NEUROLOGIC: No focal deficit.  SKIN: Intact,warm,dry. PSYCH: Cantankerous, argumentative.     Assessment & Plan:     ICD-10-CM   1. Persistent asthma with undetermined severity  J45.998 AMB REFERRAL FOR DME   Asthma/chronic asthmatic bronchitis NEEDS PFTs, rescheduled We will switch the patient to occasions via nebulization Brovana/Pulmicort, covered by Medicare  2. Cough  R05.9    Multifactorial Poorly controlled asthma GERD with LPR  3. Shortness of breath  R06.02    Likely due to poorly controlled asthma/asthmatic bronchitis Brovana/Pulmicort as above  4. Chronic GERD  K21.9    Needs to take PPI DAILY Antireflux measures  5. Non compliance w medication regimen  Z91.14    This issue adds complexity to her management Multiple barriers to compliance including medication cost Medications switched to nebulizers Better med coverage   Orders Placed This Encounter  Procedures  . AMB REFERRAL FOR DME    Referral Priority:   Routine    Referral Type:   Durable Medical Equipment Purchase    Number of Visits Requested:   1   Meds ordered this encounter  Medications  . budesonide (PULMICORT) 0.25 MG/2ML nebulizer solution    Sig: Take 2 mLs (0.25 mg total) by nebulization 2 (two) times daily.    Dispense:  360  mL    Refill:  11    Dx:j44.998  . arformoterol (BROVANA) 15 MCG/2ML NEBU    Sig: Take 2 mLs (15 mcg total) by nebulization in the morning and at bedtime.    Dispense:  120 mL    Refill:  11    Hx:J44.998   Discussion:  Patient continues to complain of cough, wheezing and shortness of breath.  He has not been compliant with medications as noted above.  We will switch respiratory medications to via nebulizer as they show better coverage from Medicare and perhaps better compliance for the patient.  In addition I am not convinced that she is using metered-dose inhaler and dry powder inhalers appropriately.  She needs to continue antireflux measures and daily PPI use as part of her cough issues could be due to reflux.  We will see her in follow-up in 4 to 6 weeks time she is to contact us prior to that time should any new difficulties arise.  Renold Don, MD [facility]   *This note was dictated using voice recognition software/Dragon.  Despite best efforts to proofread, errors can occur which can change the  meaning.  Any change was purely unintentional.

## 2020-05-23 NOTE — Patient Instructions (Signed)
We are going to change her medications to medications via nebulizer.  The 2 medications will be Brovana and Pulmicort (budesonide).  You will also get a nebulizer machine.  These medications should be used twice a day morning and evening these are not rescue medications.  You need rescue medication use your albuterol inhaler.  We proceeded to show you how to use this again.  We are scheduling breathing tests again.  We will see you in follow-up in 4 to 6 weeks time call sooner should any new difficulties arise

## 2020-05-25 ENCOUNTER — Encounter: Payer: Self-pay | Admitting: Pulmonary Disease

## 2020-05-25 ENCOUNTER — Telehealth: Payer: Self-pay | Admitting: Pulmonary Disease

## 2020-05-25 MED ORDER — BUDESONIDE 0.25 MG/2ML IN SUSP: 0.2500 mg | Freq: Two times a day (BID) | RESPIRATORY_TRACT | 11 refills | Status: DC

## 2020-05-25 NOTE — Telephone Encounter (Signed)
Rx has been faxed to [place]. Received fax confirmation.  Nothing further needed.

## 2020-05-25 NOTE — Telephone Encounter (Signed)
Spoke to [place] with [employer], who is requesting for quantity of pulmicort to be changed to 140ml. Rx has been printed and placed in Dr. Domingo Dimes folder for signature.   Rx needs to be faxed to [phone]

## 2020-05-25 NOTE — Telephone Encounter (Signed)
Lm to relay date/time of covid test prior PFT.   [facility]

## 2020-05-25 NOTE — Addendum Note (Signed)
Addended by: Claudette Head A on: 05/25/2020 02:56 PM   Modules accepted: Orders

## 2020-05-26 ENCOUNTER — Telehealth: Payer: Self-pay | Admitting: Pulmonary Disease

## 2020-05-26 NOTE — Telephone Encounter (Signed)
Lm for patient.  

## 2020-05-26 NOTE — Telephone Encounter (Signed)
Lm x2 for patient.  Will close encounter per office protocol.   

## 2020-05-29 ENCOUNTER — Other Ambulatory Visit: Payer: Self-pay

## 2020-05-29 ENCOUNTER — Other Ambulatory Visit
Admission: RE | Admit: 2020-05-29 | Discharge: 2020-05-29 | Disposition: A | Discharge status: Home / Self Care | Payer: Medicare HMO | Source: Ambulatory Visit | Attending: Pulmonary Disease | Admitting: Pulmonary Disease

## 2020-05-29 DIAGNOSIS — Z20822 Contact with and (suspected) exposure to covid-19: Secondary | ICD-10-CM | POA: Insufficient documentation

## 2020-05-29 DIAGNOSIS — Z01812 Encounter for preprocedural laboratory examination: Secondary | ICD-10-CM | POA: Diagnosis present

## 2020-05-29 LAB — SARS CORONAVIRUS 2 (TAT 6-24 HRS): SARS Coronavirus 2: NEGATIVE

## 2020-05-29 NOTE — Telephone Encounter (Signed)
Lm x2 for patient.  Will close encounter per office protocol.   

## 2020-05-30 ENCOUNTER — Telehealth: Payer: Self-pay | Admitting: Pulmonary Disease

## 2020-05-30 ENCOUNTER — Ambulatory Visit: Payer: Medicare HMO | Attending: Pulmonary Disease

## 2020-05-30 DIAGNOSIS — R0602 Shortness of breath: Secondary | ICD-10-CM | POA: Diagnosis not present

## 2020-05-30 MED ORDER — ALBUTEROL SULFATE (2.5 MG/3ML) 0.083% IN NEBU
2.5000 mg | INHALATION_SOLUTION | Freq: Once | RESPIRATORY_TRACT | Status: AC
  Administered 2020-05-30: 2.5 mg via RESPIRATORY_TRACT
  Filled 2020-05-30: qty 3

## 2020-05-30 NOTE — Telephone Encounter (Signed)
Pt returning missed call. [phone]

## 2020-05-30 NOTE — Telephone Encounter (Signed)
Called and spoke with patient who states that she can not afford the nebulizer machine was told that it was going to be $180 a month.   Called [employer] to get more info, and spoke with Tiffany. She states that patient could not afford both medications that were prescribed and that it was going to be 164.62. She states that patient got Budesonide medication as well as her nebulizer machine. Attempted to call patient back to clarify with her LMTCB

## 2020-05-30 NOTE — Telephone Encounter (Signed)
ATC patient.  LMTCB. 

## 2020-05-31 NOTE — Telephone Encounter (Signed)
Pt returning missed call. [phone] 

## 2020-06-01 NOTE — Telephone Encounter (Signed)
Lm for patient.  

## 2020-06-05 NOTE — Telephone Encounter (Signed)
ATC pt. Line rang several times without option for VM. WCB.  

## 2020-06-06 NOTE — Telephone Encounter (Signed)
Left message for patient to call back. Will close encounter since this was the 3rd attempt.

## 2020-06-15 LAB — PULMONARY FUNCTION TEST ARMC ONLY
DL/VA % pred: 94 %
DL/VA: 3.86 ml/min/mmHg/L
DLCO unc % pred: 82 %
DLCO unc: 15.37 ml/min/mmHg
FEF 25-75 Post: 1.66 L/sec
FEF 25-75 Pre: 1.53 L/sec
FEF2575-%Change-Post: 8 %
FEF2575-%Pred-Post: 122 %
FEF2575-%Pred-Pre: 112 %
FEV1-%Change-Post: 2 %
FEV1-%Pred-Post: 91 %
FEV1-%Pred-Pre: 88 %
FEV1-Post: 1.74 L
FEV1-Pre: 1.69 L
FEV1FVC-%Change-Post: 10 %
FEV1FVC-%Pred-Pre: 105 %
FEV6-%Change-Post: -6 %
FEV6-%Pred-Post: 83 %
FEV6-%Pred-Pre: 89 %
FEV6-Post: 2.02 L
FEV6-Pre: 2.17 L
FEV6FVC-%Pred-Post: 105 %
FEV6FVC-%Pred-Pre: 105 %
FVC-%Change-Post: -6 %
FVC-%Pred-Post: 79 %
FVC-%Pred-Pre: 84 %
FVC-Post: 2.03 L
Post FEV1/FVC ratio: 86 %
Post FEV6/FVC ratio: 100 %
Pre FEV1/FVC ratio: 78 %
Pre FEV6/FVC Ratio: 100 %
RV % pred: 70 %
RV: 1.7 L
TLC % pred: 81 %
TLC: 4.1 L

## 2020-06-20 ENCOUNTER — Telehealth: Payer: Self-pay | Admitting: Pulmonary Disease

## 2020-06-20 NOTE — Telephone Encounter (Signed)
Vanessa Pita, MD  06/14/2020 11:16 AM EST      Breathing test did not show any major abnormalities. They do show that she would benefit from weight loss. If her issues with wheezing persist it would be prudent to have her evaluated by ENT to make sure there is no obstruction around the throat area.   Patient is aware of results and voiced her understanding. She stated that wheezing has subsided, therefore she would like to hold off on ENT eval.  Nothing further needed at this time.

## 2020-06-29 ENCOUNTER — Ambulatory Visit: Payer: Medicare HMO | Admitting: Pulmonary Disease

## 2020-07-25 ENCOUNTER — Ambulatory Visit: Payer: Medicare HMO | Admitting: Pulmonary Disease

## 2020-08-11 ENCOUNTER — Telehealth: Payer: Self-pay | Admitting: Pulmonary Disease

## 2020-08-11 NOTE — Telephone Encounter (Signed)
Spoke to Vanessa Newman with [employer], who is calling for clarification on trelegy and Breo.  I have made Vanessa Newman aware that patient should be taking brovana and Pulmicort only per last OV note.  She voiced her understanding and had no further questions.  Nothing further needed at this time.

## 2020-09-27 NOTE — Progress Notes (Signed)
Subjective:    Patient ID: Vanessa Newman, female    DOB: 1940-11-11, 80 y.o.   MRN: [id number]  HPI Vanessa Newman is an 80 year old lifelong never smoker who presents for follow-up on persistent asthma of unknown severity.  This is a scheduled visit.  First evaluated here in December 2020.  She has not followed through with obtaining testing requested to better characterize her issues.  Here recently she has been on [facility] however she has been erratic in its use.  She presents to every visit with arguments and complaints about getting "bad service" but then admits that she does not do what is requested for diagnosis and management.  Today she has had issues with increasing shortness of breath and cough productive of yellowish sputum.  She does not endorse any fevers, chills or sweats.  She is very curt and curse with her answers to review of systems.  Not endorse any other symptomatology.  Review of Systems A 10 point review of systems was performed and it is as noted above otherwise negative.  Patient Active Problem List   Diagnosis Date Noted  . History of chest pain 08/04/2018  . Mixed incontinence urge and stress 05/30/2015  . Exertional dyspnea 01/12/2014  . Chest pain 01/12/2014  . Personal history of other malignant neoplasm of skin 09/27/2013  . 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 Synovitis 09/09/2013  . Myofascial muscle pain 09/09/2013  . Lumbar spinal stenosis 09/09/2013  . Hyperlipidemia, unspecified 09/09/2013  . HTN (hypertension) 09/09/2013  . GERD (gastroesophageal reflux disease) 09/09/2013  . Depression 09/09/2013   Social History   Tobacco Use  . Smoking status: Never Smoker  . Smokeless tobacco: Never Used  Substance Use Topics  . Alcohol use: No   No Known Allergies  Though she states that prednisone "makes her crazy".  Current Meds  Medication Sig  . Albuterol Sulfate (PROAIR HFA IN) Inhale into the lungs. (Patient not taking: Reported on 05/23/2020)  .  aspirin 81 MG tablet Take 81 mg by mouth daily.  Marland Kitchen atorvastatin (LIPITOR) 40 MG tablet Take 40 mg by mouth daily.  . calcium carbonate (OS-CAL) 600 MG TABS tablet Take 600 mg by mouth daily with breakfast.  . citalopram (CELEXA) 20 MG tablet Take 20 mg by mouth daily.   Marland Kitchen estrogens, conjugated, (PREMARIN) 0.625 MG tablet Take 0.625 mg by mouth daily. Take daily for 21 days then do not take for 7 days.  Marland Kitchen gabapentin (NEURONTIN) 800 MG tablet Take 800 mg by mouth 3 (three) times daily.   Marland Kitchen HYDROcodone-acetaminophen (NORCO/VICODIN) 5-325 MG tablet Take 1 tablet by mouth every 6 (six) hours as needed for moderate pain.  . Multiple Vitamins-Minerals (EYE VITAMINS & MINERALS PO) Take by mouth.  Marland Kitchen omeprazole (PRILOSEC) 20 MG capsule Take 20 mg by mouth daily.  Marland Kitchen senna (SENOKOT) 8.6 MG TABS tablet Take 1 tablet by mouth.  . triamterene-hydrochlorothiazide (MAXZIDE-25) 37.5-25 MG per tablet Take 1 tablet by mouth daily.  . Vitamin D, Cholecalciferol, 400 UNITS CHEW Chew 400 Units by mouth daily.  .  fluticasone furoate-vilanterol (BREO ELLIPTA) 100-25 MCG/INH AEPB Inhale 1 puff into the lungs daily.        Objective:   Physical Exam BP 126/84 (BP Location: Left Arm, Patient Position: Sitting, Cuff Size: Normal)   Pulse 69   Temp (!) 97.1 F (36.2 C) (Temporal)   Ht 5\' 3"  (1.6 m)   Wt 203 lb 9.6 oz (92.4 kg)   SpO2 96%  BMI 36.07 kg/m   GENERAL: Woman, in no acute distress, fully ambulatory. No conversational dyspnea. HEAD: Normocephalic, atraumatic.  EYES: Pupils equal, round, reactive to light.  No scleral icterus.  MOUTH: Nose/mouth/throat not examined due to masking requirements for COVID 19. NECK: Supple. No thyromegaly. Trachea midline. No JVD.  No adenopathy. PULMONARY: Good air entry bilaterally. Diffuse scattered wheezes and rhonchi. No rales. CARDIOVASCULAR: S1 and S2. Regular rate and rhythm. No rubs, murmurs or gallops heard. ABDOMEN: Obese, otherwise benign. MUSCULOSKELETAL:  No joint deformity, no clubbing, no edema.  NEUROLOGIC: No focal deficit.  SKIN: Intact,warm,dry. PSYCH: Cantankerous, argumentative.     Assessment & Plan:     ICD-10-CM   1. Persistent asthma with acute exacerbation, unspecified asthma severity  J45.901    Continue Breo Ellipta She is sensitive to prednisone Montelukast 1 tablet daily Azithromycin as anti-inflammatory  2. SOB (shortness of breath)  R06.02 Pulmonary Function Test [facility] Only    ECHOCARDIOGRAM COMPLETE   Worsened likely due to exacerbation Suspect allergic component  3. H/O noncompliance with medical treatment, presenting hazards to health  Z91.19    She does not seem to adhere to treatment plan Has failed to show for the testing   Orders Placed This Encounter  Procedures  . Pulmonary Function Test [facility] Only    Standing Status:   Future    Number of Occurrences:   1    Standing Expiration Date:   04/13/2021    Order Specific Question:   Full PFT: includes the following: basic spirometry, spirometry pre & post bronchodilator, diffusion capacity (DLCO), lung volumes    Answer:   Full PFT  . ECHOCARDIOGRAM COMPLETE    Standing Status:   Future    Number of Occurrences:   1    Standing Expiration Date:   04/13/2021    Order Specific Question:   Where should this test be performed    Answer:   [facility]    Order Specific Question:   Perflutren DEFINITY (image enhancing agent) should be administered unless hypersensitivity or allergy exist    Answer:   Administer Perflutren    Order Specific Question:   Reason for exam-Echo    Answer:   Dyspnea  786.09 / R06.00   Meds ordered this encounter  Medications  .  montelukast (SINGULAIR) 10 MG tablet    Sig: Take 1 tablet (10 mg total) by mouth daily.    Dispense:  30 tablet    Refill:  3  . azithromycin (ZITHROMAX) 250 MG tablet    Sig: Take 2 tablets (500 mg) on  Day 1,  followed by 1 tablet (250 mg) once daily on Days 2 through 5.    Dispense:  6 each     Refill:  0   She continues to be poorly compliant with medications.  We have reordered testing as above.  Hopefully she will comply this time.  We will see her in follow-up in 6 to 8 weeks time she is to contact us prior to that time should any new problems arise.  Renold Don, MD [facility]   *This note was dictated using voice recognition software/Dragon.  Despite best efforts to proofread, errors can occur which can change the meaning.  Any change was purely unintentional.

## 2020-10-11 ENCOUNTER — Other Ambulatory Visit: Payer: Self-pay

## 2020-10-11 ENCOUNTER — Encounter: Payer: Self-pay | Admitting: Emergency Medicine

## 2020-10-11 ENCOUNTER — Ambulatory Visit
Admission: EM | Admit: 2020-10-11 | Discharge: 2020-10-11 | Disposition: A | Discharge status: Home / Self Care | Payer: Medicare HMO | Attending: Emergency Medicine | Admitting: Emergency Medicine

## 2020-10-11 DIAGNOSIS — M5442 Lumbago with sciatica, left side: Secondary | ICD-10-CM

## 2020-10-11 MED ORDER — PREDNISONE 20 MG PO TABS: 20.0000 mg | ORAL_TABLET | Freq: Two times a day (BID) | ORAL | 0 refills | Status: DC

## 2020-10-11 MED ORDER — TIZANIDINE HCL 2 MG PO TABS: 2.0000 mg | ORAL_TABLET | Freq: Every day | ORAL | 0 refills | Status: DC

## 2020-10-11 MED ORDER — DEXAMETHASONE SODIUM PHOSPHATE 10 MG/ML IJ SOLN: 10.0000 mg | Freq: Once | INTRAMUSCULAR | Status: DC

## 2020-10-11 NOTE — ED Triage Notes (Addendum)
Pain in middle of buttock that runs down LT leg and swelling to back of LT knee x 2 weeks.  Denies any injury

## 2020-10-11 NOTE — Discharge Instructions (Addendum)
Unable to rule out blood clot in urgent care setting.  Offered patient further evaluation and management in the ED.  Patient declines at this time and would like to try outpatient therapy first.  Aware of the risk associated with this decision including missed diagnosis, organ damage, organ failure, and/or death.  Patient aware and in agreement.     Steroid shot given in office  Continue conservative management of rest, ice, and gentle stretches Take zanaflex at nighttime for symptomatic relief. Avoid driving or operating heavy machinery while using medication. Follow up with PCP if symptoms persist Return or go to the ER if you have any new or worsening symptoms (fever, chills, chest pain, abdominal pain, changes in bowel or bladder habits, pain radiating into lower legs, etc...)

## 2020-10-11 NOTE — ED Provider Notes (Addendum)
[place]   [id number] 10/11/20 Arrival Time: 37  CC: Back PAIN  SUBJECTIVE: History from: patient. Vanessa Newman is a 80 y.o. female complains of LT low back pain x 3 weeks.  Denies a precipitating event or specific injury.  Localizes the pain to the LT low back.  Describes the pain as constant and dull in character.  Has tried OTC medications without relief.  Symptoms are made worse with movement.  Denies similar symptoms in the past.  Reports symptoms into LT LE.  Denies fever, chills, erythema, ecchymosis, effusion, weakness, numbness and tingling.  Denies SOB, calf pain or swelling, recent long travel, recent surgery, hormone use, tobacco use, malignancy, hx of blood clot.     ROS: As per HPI.  All other pertinent ROS negative.     Past Medical History:  Diagnosis Date  . Acid reflux   . Anxiety and depression   . Cystocele    2nd degree  . Fibroids   . Hypercholesteremia   . Hypertension   . Left lower quadrant pain   . Melanoma ([place])   . Rectocele    2nd degree  . SUI (stress urinary incontinence, female)   . Urinary incontinence   . Vaginal atrophy    Past Surgical History:  Procedure Laterality Date  . right knee replacement Right 2010  . TOTAL HIP ARTHROPLASTY Right 2013   No Known Allergies No current facility-administered medications on file prior to encounter.   Current Outpatient Medications on File Prior to Encounter  Medication Sig Dispense Refill  . Albuterol Sulfate (PROAIR HFA IN) Inhale into the lungs. (Patient not taking: Reported on 05/23/2020)    . arformoterol (BROVANA) 15 MCG/2ML NEBU Take 2 mLs (15 mcg total) by nebulization in the morning and at bedtime. 120 mL 11  . aspirin 81 MG tablet Take 81 mg by mouth daily.    Marland Kitchen atorvastatin (LIPITOR) 40 MG tablet Take 40 mg by mouth daily.    . budesonide (PULMICORT) 0.25 MG/2ML nebulizer solution Take 2 mLs (0.25 mg total) by nebulization 2 (two) times daily. 120 mL 11  . calcium  carbonate (OS-CAL) 600 MG TABS tablet Take 600 mg by mouth daily with breakfast.    . citalopram (CELEXA) 20 MG tablet Take 20 mg by mouth daily.     Marland Kitchen estrogens, conjugated, (PREMARIN) 0.625 MG tablet Take 0.625 mg by mouth daily. Take daily for 21 days then do not take for 7 days.    Marland Kitchen gabapentin (NEURONTIN) 800 MG tablet Take 800 mg by mouth 3 (three) times daily.     Marland Kitchen HYDROcodone-acetaminophen (NORCO/VICODIN) 5-325 MG tablet Take 1 tablet by mouth every 6 (six) hours as needed for moderate pain.    Marland Kitchen ibuprofen (ADVIL,MOTRIN) 600 MG tablet Take 600 mg by mouth every 8 (eight) hours as needed.    . Multiple Vitamins-Minerals (EYE VITAMINS & MINERALS PO) Take by mouth.    Marland Kitchen omeprazole (PRILOSEC) 20 MG capsule Take 20 mg by mouth daily.    Marland Kitchen senna (SENOKOT) 8.6 MG TABS tablet Take 1 tablet by mouth.    . Spacer/Aero-Holding Chambers (AEROCHAMBER MV) inhaler Use as instructed 1 each 0  . triamterene-hydrochlorothiazide (MAXZIDE-25) 37.5-25 MG per tablet Take 1 tablet by mouth daily.    . Vitamin D, Cholecalciferol, 400 UNITS CHEW Chew 400 Units by mouth daily.     Social History   Socioeconomic History  . Marital status: Single    Spouse name: Not on file  .  Number of children: Not on file  . Years of education: Not on file  . Highest education level: Not on file  Occupational History  . Not on file  Tobacco Use  . Smoking status: Never Smoker  . Smokeless tobacco: Never Used  Substance and Sexual Activity  . Alcohol use: No  . Drug use: No  . Sexual activity: Yes  Other Topics Concern  . Not on file  Social History Narrative  . Not on file   Social Determinants of Health   Financial Resource Strain: Not on file  Food Insecurity: Not on file  Transportation Needs: Not on file  Physical Activity: Not on file  Stress: Not on file  Social Connections: Not on file  Intimate Partner Violence: Not on file   Family History  Problem Relation Age of Onset  . Breast cancer Mother  76  . Colon cancer Father   . Cancer Father        lung    OBJECTIVE:  Vitals:   10/11/20 1101  BP: (!) 145/83  Pulse: 68  Resp: 18  Temp: 98.1 F (36.7 C)  TempSrc: Oral  SpO2: 97%    General appearance: ALERT; in no acute distress.  Head: NCAT Lungs: Normal respiratory effort Musculoskeletal: Back Inspection: Skin warm, dry, clear and intact without obvious erythema, effusion, or ecchymosis.  Palpation: mildly TTP over LT low back ROM: FROM active and passive Strength: 5/5 hip flexion, 5/5 hip extension Skin: warm and dry Neurologic: Ambulates without difficulty  Psychological: alert and cooperative; normal mood and affect   ASSESSMENT & PLAN:  1. Acute left-sided low back pain with left-sided sciatica    Meds ordered this encounter  Medications  . predniSONE (DELTASONE) 20 MG tablet    Sig: Take 1 tablet (20 mg total) by mouth 2 (two) times daily with a meal for 5 days.    Dispense:  10 tablet    Refill:  0    Order Specific Question:   Supervising Provider    Answer:   Raylene Everts [[id number]]  . tiZANidine (ZANAFLEX) 2 MG tablet    Sig: Take 1 tablet (2 mg total) by mouth at bedtime.    Dispense:  20 tablet    Refill:  0    Order Specific Question:   Supervising Provider    Answer:   Raylene Everts [[id number]]  . dexamethasone (DECADRON) injection 10 mg   Unable to rule out blood clot in urgent care setting.  Offered patient further evaluation and management in the ED.  Patient declines at this time and would like to try outpatient therapy first.  Aware of the risk associated with this decision including missed diagnosis, organ damage, organ failure, and/or death.  Patient aware and in agreement.     Continue conservative management of rest, ice, and gentle stretches Take zanaflex at nighttime for symptomatic relief. Avoid driving or operating heavy machinery while using medication. Follow up with PCP if symptoms persist Return or go to the ER if you  have any new or worsening symptoms (fever, chills, chest pain, abdominal pain, changes in bowel or bladder habits, pain radiating into lower legs, etc...)    Reviewed expectations re: course of current medical issues. Questions answered. Outlined signs and symptoms indicating need for more acute intervention. Patient verbalized understanding. After Visit Summary given.   Prednisone and steroid shot discontinued due to [place], [place], PA-C 10/11/20 [place], [place], [place] 10/11/20  [place], [place], PA-C 10/11/20 1139

## 2020-10-19 ENCOUNTER — Other Ambulatory Visit: Payer: Self-pay | Admitting: Family Medicine

## 2020-10-19 DIAGNOSIS — Z1231 Encounter for screening mammogram for malignant neoplasm of breast: Secondary | ICD-10-CM

## 2020-11-27 ENCOUNTER — Other Ambulatory Visit: Payer: Self-pay | Admitting: Orthopedic Surgery

## 2020-11-27 DIAGNOSIS — M5416 Radiculopathy, lumbar region: Secondary | ICD-10-CM

## 2020-11-28 ENCOUNTER — Ambulatory Visit
Admission: RE | Admit: 2020-11-28 | Discharge: 2020-11-28 | Disposition: A | Discharge status: Home / Self Care | Payer: Medicare HMO | Source: Ambulatory Visit | Attending: Family Medicine | Admitting: Family Medicine

## 2020-11-28 ENCOUNTER — Other Ambulatory Visit: Payer: Self-pay

## 2020-11-28 DIAGNOSIS — Z1231 Encounter for screening mammogram for malignant neoplasm of breast: Secondary | ICD-10-CM | POA: Insufficient documentation

## 2020-12-09 ENCOUNTER — Ambulatory Visit: Payer: Medicare HMO

## 2020-12-09 ENCOUNTER — Ambulatory Visit
Admission: EM | Admit: 2020-12-09 | Discharge: 2020-12-09 | Disposition: A | Discharge status: Home / Self Care | Payer: Medicare HMO | Attending: Emergency Medicine | Admitting: Emergency Medicine

## 2020-12-09 DIAGNOSIS — R6889 Other general symptoms and signs: Secondary | ICD-10-CM | POA: Diagnosis not present

## 2020-12-09 MED ORDER — ACETAMINOPHEN 325 MG PO TABS
650.0000 mg | ORAL_TABLET | Freq: Once | ORAL | Status: AC
  Administered 2020-12-09: 650 mg via ORAL

## 2020-12-09 MED ORDER — ONDANSETRON HCL 4 MG PO TABS: 4.0000 mg | ORAL_TABLET | Freq: Four times a day (QID) | ORAL | 0 refills | Status: DC

## 2020-12-09 NOTE — ED Provider Notes (Signed)
[place]   [id number] 12/09/20 Arrival Time: 4944   CC: COVID symptoms  SUBJECTIVE: History from: patient.  Vanessa Newman is a 80 y.o. female who presents with body aches and vomiting (1 episode) x 2 days  Denies sick exposure to COVID, flu or strep.  Has tried OTC medications without relief.  Denies aggravating factors. Denies previous symptoms in the past.   Denies sinus pain, rhinorrhea, sore throat, SOB, wheezing, chest pain, nausea, changes in bowel or bladder habits.    ROS: As per HPI.  All other pertinent ROS negative.     Past Medical History:  Diagnosis Date   Acid reflux    Anxiety and depression    Cystocele    2nd degree   Fibroids    Hypercholesteremia    Hypertension    Left lower quadrant pain    Melanoma ([facility])    Rectocele    2nd degree   SUI (stress urinary incontinence, female)    Urinary incontinence    Vaginal atrophy    Past Surgical History:  Procedure Laterality Date   right knee replacement Right 2010   TOTAL HIP ARTHROPLASTY Right 2013   Allergies  Allergen Reactions   Prednisone Anxiety    Pt reports it makes her have suicidal thoughts   No current facility-administered medications on file prior to encounter.   Current Outpatient Medications on File Prior to Encounter  Medication Sig Dispense Refill   Albuterol Sulfate (PROAIR HFA IN) Inhale into the lungs. (Patient not taking: Reported on 05/23/2020)     arformoterol (BROVANA) 15 MCG/2ML NEBU Take 2 mLs (15 mcg total) by nebulization in the morning and at bedtime. 120 mL 11   aspirin 81 MG tablet Take 81 mg by mouth daily.     atorvastatin (LIPITOR) 40 MG tablet Take 40 mg by mouth daily.     budesonide (PULMICORT) 0.25 MG/2ML nebulizer solution Take 2 mLs (0.25 mg total) by nebulization 2 (two) times daily. 120 mL 11   calcium carbonate (OS-CAL) 600 MG TABS tablet Take 600 mg by mouth daily with breakfast.     citalopram (CELEXA) 20 MG tablet Take 20 mg by mouth daily.       estrogens, conjugated, (PREMARIN) 0.625 MG tablet Take 0.625 mg by mouth daily. Take daily for 21 days then do not take for 7 days.     gabapentin (NEURONTIN) 800 MG tablet Take 800 mg by mouth 3 (three) times daily.      HYDROcodone-acetaminophen (NORCO/VICODIN) 5-325 MG tablet Take 1 tablet by mouth every 6 (six) hours as needed for moderate pain.     ibuprofen (ADVIL,MOTRIN) 600 MG tablet Take 600 mg by mouth every 8 (eight) hours as needed.     Multiple Vitamins-Minerals (EYE VITAMINS & MINERALS PO) Take by mouth.     omeprazole (PRILOSEC) 20 MG capsule Take 20 mg by mouth daily.     senna (SENOKOT) 8.6 MG TABS tablet Take 1 tablet by mouth.     Spacer/Aero-Holding Chambers (AEROCHAMBER MV) inhaler Use as instructed 1 each 0   tiZANidine (ZANAFLEX) 2 MG tablet Take 1 tablet (2 mg total) by mouth at bedtime. 20 tablet 0   triamterene-hydrochlorothiazide (MAXZIDE-25) 37.5-25 MG per tablet Take 1 tablet by mouth daily.     Vitamin D, Cholecalciferol, 400 UNITS CHEW Chew 400 Units by mouth daily.     Social History   Socioeconomic History   Marital status: Single    Spouse name: Not on file  Number of children: Not on file   Years of education: Not on file   Highest education level: Not on file  Occupational History   Not on file  Tobacco Use   Smoking status: Never   Smokeless tobacco: Never  Substance and Sexual Activity   Alcohol use: No   Drug use: No   Sexual activity: Yes  Other Topics Concern   Not on file  Social History Narrative   Not on file   Social Determinants of Health   Financial Resource Strain: Not on file  Food Insecurity: Not on file  Transportation Needs: Not on file  Physical Activity: Not on file  Stress: Not on file  Social Connections: Not on file  Intimate Partner Violence: Not on file   Family History  Problem Relation Age of Onset   Breast cancer Mother 44   Colon cancer Father    Cancer Father        lung    OBJECTIVE:  Vitals:    12/09/20 0922  BP: 136/65  Pulse: 95  Resp: 16  Temp: (S) (!) 101.6 F (38.7 C)  TempSrc: Oral  SpO2: 94%    General appearance: alert; appears fatigued, but nontoxic; speaking in full sentences and tolerating own secretions HEENT: NCAT; Ears: EACs clear, TMs pearly gray; Eyes: PERRL.  EOM grossly intact. Nose: nares patent without rhinorrhea, Throat: oropharynx clear, tonsils non erythematous or enlarged, uvula midline  Neck: supple without LAD Lungs: unlabored respirations, symmetrical air entry; cough: absent; no respiratory distress; CTAB Heart: regular rate and rhythm.   Skin: warm and dry Psychological: alert and cooperative; normal mood and affect  ASSESSMENT & PLAN:  1. Flu-like symptoms     Meds ordered this encounter  Medications   ondansetron (ZOFRAN) 4 MG tablet    Sig: Take 1 tablet (4 mg total) by mouth every 6 (six) hours.    Dispense:  12 tablet    Refill:  0    Order Specific Question:   Supervising Provider    Answer:   Raylene Everts [[id number]]   acetaminophen (TYLENOL) tablet 650 mg    COVID testing ordered.  It will take between 5-7 days for test results.  Someone will contact you regarding abnormal results.    In the meantime: You should remain isolated in your home for 5 days from symptom onset AND greater than 72 hours after symptoms resolution (absence of fever without the use of fever-reducing medication and improvement in respiratory symptoms), whichever is longer Get plenty of rest and push fluids Zofran for nausea and vomiting Use OTC medications like ibuprofen or tylenol as needed fever or pain Call or go to the ED if you have any new or worsening symptoms such as fever, worsening cough, shortness of breath, chest tightness, chest pain, turning blue, changes in mental status, etc...   Reviewed expectations re: course of current medical issues. Questions answered. Outlined signs and symptoms indicating need for more acute  intervention. Patient verbalized understanding. After Visit Summary given.          Lestine Box, PA-C 12/09/20 [phone]

## 2020-12-09 NOTE — ED Triage Notes (Signed)
Patient presents to Urgent Care with complaints of body aches since Thursday. She states one episodes of vomiting today. Treating pain with ASA.   Denies fever.

## 2020-12-09 NOTE — Discharge Instructions (Addendum)
COVID testing ordered.  It will take between 5-7 days for test results.  Someone will contact you regarding abnormal results.    In the meantime: You should remain isolated in your home for 5 days from symptom onset AND greater than 72 hours after symptoms resolution (absence of fever without the use of fever-reducing medication and improvement in respiratory symptoms), whichever is longer Get plenty of rest and push fluids Zofran for nausea and vomiting Use OTC medications like ibuprofen or tylenol as needed fever or pain Call or go to the ED if you have any new or worsening symptoms such as fever, worsening cough, shortness of breath, chest tightness, chest pain, turning blue, changes in mental status, etc..Marland Kitchen

## 2020-12-10 LAB — SARS-COV-2, NAA 2 DAY TAT

## 2020-12-10 LAB — NOVEL CORONAVIRUS, NAA: SARS-CoV-2, NAA: NOT DETECTED

## 2020-12-21 ENCOUNTER — Other Ambulatory Visit: Payer: Self-pay

## 2020-12-21 ENCOUNTER — Ambulatory Visit
Admission: RE | Admit: 2020-12-21 | Discharge: 2020-12-21 | Disposition: A | Discharge status: Home / Self Care | Payer: Medicare HMO | Source: Ambulatory Visit | Attending: Orthopedic Surgery | Admitting: Orthopedic Surgery

## 2020-12-21 DIAGNOSIS — M5416 Radiculopathy, lumbar region: Secondary | ICD-10-CM | POA: Diagnosis not present

## 2021-06-10 HISTORY — PX: ROBOTIC ASSISTED LAPAROSCOPIC REPAIR OF PARAESOPHAGEAL HERNIA: SHX6606

## 2021-07-08 ENCOUNTER — Emergency Department: Payer: Medicare HMO

## 2021-07-08 ENCOUNTER — Encounter: Payer: Self-pay | Admitting: Emergency Medicine

## 2021-07-08 ENCOUNTER — Other Ambulatory Visit: Payer: Self-pay

## 2021-07-08 ENCOUNTER — Emergency Department
Admission: EM | Admit: 2021-07-08 | Discharge: 2021-07-08 | Disposition: A | Discharge status: Home / Self Care | Payer: Medicare HMO | Attending: Emergency Medicine | Admitting: Emergency Medicine

## 2021-07-08 DIAGNOSIS — E876 Hypokalemia: Secondary | ICD-10-CM | POA: Diagnosis not present

## 2021-07-08 DIAGNOSIS — U071 COVID-19: Secondary | ICD-10-CM

## 2021-07-08 DIAGNOSIS — R944 Abnormal results of kidney function studies: Secondary | ICD-10-CM | POA: Diagnosis not present

## 2021-07-08 DIAGNOSIS — K449 Diaphragmatic hernia without obstruction or gangrene: Secondary | ICD-10-CM

## 2021-07-08 DIAGNOSIS — I10 Essential (primary) hypertension: Secondary | ICD-10-CM | POA: Diagnosis not present

## 2021-07-08 DIAGNOSIS — R0602 Shortness of breath: Secondary | ICD-10-CM | POA: Diagnosis present

## 2021-07-08 DIAGNOSIS — J45909 Unspecified asthma, uncomplicated: Secondary | ICD-10-CM | POA: Insufficient documentation

## 2021-07-08 HISTORY — DX: Type 2 diabetes mellitus without complications: E11.9

## 2021-07-08 LAB — CBC
HCT: 41.1 % (ref 36.0–46.0)
Hemoglobin: 13.4 g/dL (ref 12.0–15.0)
MCH: 27.8 pg (ref 26.0–34.0)
MCHC: 32.6 g/dL (ref 30.0–36.0)
MCV: 85.3 fL (ref 80.0–100.0)
Platelets: 475 10*3/uL — ABNORMAL HIGH (ref 150–400)
RBC: 4.82 MIL/uL (ref 3.87–5.11)
RDW: 15.4 % (ref 11.5–15.5)
WBC: 7.8 10*3/uL (ref 4.0–10.5)
nRBC: 0 % (ref 0.0–0.2)

## 2021-07-08 LAB — CBC WITH DIFFERENTIAL/PLATELET
Abs Immature Granulocytes: 0.02 10*3/uL (ref 0.00–0.07)
Basophils Absolute: 0.1 10*3/uL (ref 0.0–0.1)
Basophils Relative: 1 %
Eosinophils Absolute: 0.1 10*3/uL (ref 0.0–0.5)
Eosinophils Relative: 1 %
HCT: 42 % (ref 36.0–46.0)
Hemoglobin: 13.3 g/dL (ref 12.0–15.0)
Immature Granulocytes: 0 %
Lymphocytes Relative: 22 %
Lymphs Abs: 1.7 10*3/uL (ref 0.7–4.0)
MCH: 28.1 pg (ref 26.0–34.0)
MCHC: 31.7 g/dL (ref 30.0–36.0)
MCV: 88.8 fL (ref 80.0–100.0)
Monocytes Absolute: 0.5 10*3/uL (ref 0.1–1.0)
Monocytes Relative: 7 %
Neutro Abs: 5.3 10*3/uL (ref 1.7–7.7)
Neutrophils Relative %: 69 %
Platelets: 496 10*3/uL — ABNORMAL HIGH (ref 150–400)
RBC: 4.73 MIL/uL (ref 3.87–5.11)
RDW: 15.3 % (ref 11.5–15.5)
WBC: 7.7 10*3/uL (ref 4.0–10.5)
nRBC: 0 % (ref 0.0–0.2)

## 2021-07-08 LAB — BASIC METABOLIC PANEL
Anion gap: 12 (ref 5–15)
BUN: 18 mg/dL (ref 8–23)
CO2: 22 mmol/L (ref 22–32)
Calcium: 9.9 mg/dL (ref 8.9–10.3)
Chloride: 104 mmol/L (ref 98–111)
Creatinine, Ser: 1.14 mg/dL — ABNORMAL HIGH (ref 0.44–1.00)
GFR, Estimated: 48 mL/min — ABNORMAL LOW (ref >60)
Glucose, Bld: 120 mg/dL — ABNORMAL HIGH (ref 70–99)
Potassium: 3.2 mmol/L — ABNORMAL LOW (ref 3.5–5.1)
Sodium: 138 mmol/L (ref 135–145)

## 2021-07-08 LAB — HEPATIC FUNCTION PANEL
ALT: 24 U/L (ref 0–44)
AST: 32 U/L (ref 15–41)
Albumin: 4.3 g/dL (ref 3.5–5.0)
Alkaline Phosphatase: 109 U/L (ref 38–126)
Bilirubin, Direct: 0.2 mg/dL (ref 0.0–0.2)
Indirect Bilirubin: 0.9 mg/dL (ref 0.3–0.9)
Total Bilirubin: 1.1 mg/dL (ref 0.3–1.2)
Total Protein: 7.5 g/dL (ref 6.5–8.1)

## 2021-07-08 LAB — RESP PANEL BY RT-PCR (FLU A&B, COVID) ARPGX2
Influenza A by PCR: NEGATIVE
Influenza B by PCR: NEGATIVE
SARS Coronavirus 2 by RT PCR: POSITIVE — AB

## 2021-07-08 LAB — TROPONIN I (HIGH SENSITIVITY)
Troponin I (High Sensitivity): 4 ng/L (ref <18)
Troponin I (High Sensitivity): 4 ng/L (ref <18)

## 2021-07-08 LAB — LIPASE, BLOOD: Lipase: 29 U/L (ref 11–51)

## 2021-07-08 MED ORDER — NIRMATRELVIR/RITONAVIR (PAXLOVID) TABLET (RENAL DOSING)
2.0000 | ORAL_TABLET | Freq: Two times a day (BID) | ORAL | Status: DC
  Administered 2021-07-08: 2 via ORAL
  Filled 2021-07-08: qty 20

## 2021-07-08 MED ORDER — POTASSIUM CHLORIDE CRYS ER 20 MEQ PO TBCR
40.0000 meq | EXTENDED_RELEASE_TABLET | Freq: Once | ORAL | Status: AC
  Administered 2021-07-08: 40 meq via ORAL
  Filled 2021-07-08: qty 2

## 2021-07-08 MED ORDER — ACETAMINOPHEN 325 MG PO TABS
650.0000 mg | ORAL_TABLET | Freq: Once | ORAL | Status: AC
  Administered 2021-07-08: 650 mg via ORAL
  Filled 2021-07-08: qty 2

## 2021-07-08 MED ORDER — SODIUM CHLORIDE 0.9 % IV BOLUS
500.0000 mL | Freq: Once | INTRAVENOUS | Status: AC
  Administered 2021-07-08: 500 mL via INTRAVENOUS

## 2021-07-08 MED ORDER — IOHEXOL 350 MG/ML SOLN
80.0000 mL | Freq: Once | INTRAVENOUS | Status: AC | PRN
  Administered 2021-07-08: 80 mL via INTRAVENOUS

## 2021-07-08 NOTE — ED Notes (Signed)
Pt alert, NAD, calm, interactive, resps e/u, speaking in clear complete sentences, VSS. To CT at this time.

## 2021-07-08 NOTE — ED Provider Notes (Addendum)
[facility] Provider Note    None    (approximate)   History   Shortness of Breath   HPI  Vanessa Newman is a 81 y.o. female  with GERD with HTN and Asthma who comes in with epigastic pain and SOB.  Reports 1 month ago she had some discomfort in her epigastric area and she went to a GI doctor and plan for endoscopy.  The pain went away and then yesterday the pain came back and felt nausea and vomit x1 NBNB today.  The pain got better.  Felt like she was going to pass out. States she hasnt eaten or drank since last night. Does not feel short of breath now- reports she used her inhaler. She reports no pain or swelling in legs, no estrogen use.    Physical Exam   Triage Vital Signs: ED Triage Vitals  Enc Vitals Group     BP 07/08/21 0638 (!) 155/90     Pulse Rate 07/08/21 0638 85     Resp 07/08/21 0638 18     Temp 07/08/21 0638 97.9 F (36.6 C)     Temp Source 07/08/21 0638 Oral     SpO2 07/08/21 0638 100 %     Weight 07/08/21 0638 189 lb (85.7 kg)     Height 07/08/21 0638 5\' 3"  (1.6 m)     Head Circumference --      Peak Flow --      Pain Score 07/08/21 0637 0     Pain Loc --      Pain Edu? --      Excl. in [place]? --     Most recent vital signs: Vitals:   07/08/21 0638  BP: (!) 155/90  Pulse: 85  Resp: 18  Temp: 97.9 F (36.6 C)  SpO2: 100%     General: Awake, no distress.  CV:  Good peripheral perfusion.  Resp:  Normal effort. Clear lungs  Abd:  No distention. Tender in her epigastric region.  Other:  No swelling in the legs, non tender calfs    ED Results / Procedures / Treatments   Labs (all labs ordered are listed, but only abnormal results are displayed) Labs Reviewed  BASIC METABOLIC PANEL - Abnormal; Notable for the following components:      Result Value   Potassium 3.2 (*)    Glucose, Bld 120 (*)    Creatinine, Ser 1.14 (*)    GFR, Estimated 48 (*)    All other components within normal limits  CBC - Abnormal; Notable  for the following components:   Platelets 475 (*)    All other components within normal limits  HEPATIC FUNCTION PANEL  LIPASE, BLOOD  DIFFERENTIAL  TROPONIN I (HIGH SENSITIVITY)     EKG  My interpretation of EKG: Normal sinus rate of 88, no st elevation, no twi, normal intervals    RADIOLOGY I have reviewed the xray personally and agree with radiology read possible RLL infiltrate, hiatal hernia   CT reviewed personally pending rads read    PROCEDURES:  Critical Care performed: No  .1-3 Lead EKG Interpretation Performed by: Vanessa Viola, MD Authorized by: Vanessa Knobel, MD     Interpretation: normal     ECG rate:  70   ECG rate assessment: normal     Rhythm: sinus rhythm     Ectopy: none     Conduction: normal     MEDICATIONS ORDERED IN ED: Medications - No data  to display   IMPRESSION / MDM / [place] / ED COURSE  I reviewed the triage vital signs and the nursing notes.                              Pt with HTN and asthma coming in with epigastic pain. Differential diagnosis includes, but is not limited to, ACS, dissection given the pain radiates from the chest into the abdomen, acid reflux.  Will get CT imaging, EKG, cardiac markers to further evaluate.  She reports some shortness of breath in triage but does not sound like she is really short of breath at all.  Her lungs are clear without any evidence of wheezing to suggest an asthma flare.  Her oxygen levels 100%.  She denies shortness of breath at this time. Doubt PE.   Troponin is negative x2 BMP slightly elevated potassium given some p.o. kidney function slightly elevated given some fluids CBC is reassuring except for slightly elevated platelets but no other infectious symptoms.  Patient afebrile.  Chest x-ray concerning for possible pneumonia but slightly hard to appreciate patient does not really have symptoms that are suggestive of pneumonia.  CT imaging does not show any evidence of  pneumonia therefore I doubt that there is any pneumonia.  On repeat evaluation patient is feeling much better.  She does have large hiatal hernia which I suspect is the cause of patient's pain.  She is already been increased to a higher dose of PPI and I recommended Tylenol for pain.  SHe is going to follow-up with GI for this but I suspect that this is the cause of the pain  Given her age and the epigastric pain with her risk factors I did consider admission for cardiac evaluation but given her EKG and troponins are negative and her CT shows a large hiatal hernia I suspect that this is the cause of her pain.  Patient is looking much better tolerating p.o. and feels comfortable with discharge home   The patient is on the cardiac monitor to evaluate for evidence of arrhythmia and/or significant heart rate changes.  10:32 AM COVID test was positive and patient does report a little bit of shortness of breath when she first got here.  Given this seems like her symptoms are within 5 days we will discussed with pharmacy for renally dose paxlovid.  Pt is vaccinated.      FINAL CLINICAL IMPRESSION(S) / ED DIAGNOSES   Final diagnoses:  Hypokalemia  Hiatal hernia     Rx / DC Orders   ED Discharge Orders     None        Note:  This document was prepared using Dragon voice recognition software and may include unintentional dictation errors.   Vanessa [place], MD 07/08/21 1004    Vanessa [place], MD 07/08/21 1032

## 2021-07-08 NOTE — ED Triage Notes (Signed)
Pt arrived via POV with family reports epigastric pain and shortness of breath, pt states she used her inhaler with no relief.  No distress noted at this time.

## 2021-07-08 NOTE — Discharge Instructions (Addendum)
Please discuss with your GI doctor the next steps for your hiatal hernia.  Take Tylenol 1 g every 8 hours to help with any pain.  Avoid NSAIDs such as ibuprofen.  Continue take your acid reducer.  IMPRESSION: 1. No evidence of thoracic or abdominal aortic aneurysm or dissection. 2. Large hiatal hernia. 3. Extensive colonic diverticulosis without findings of acute diverticulitis. 4.  Aortic Atherosclerosis (ICD10-I70.0).

## 2021-07-26 ENCOUNTER — Encounter: Payer: Self-pay | Admitting: Emergency Medicine

## 2021-07-27 ENCOUNTER — Ambulatory Visit: Payer: Medicare HMO | Admitting: Certified Registered"

## 2021-07-27 ENCOUNTER — Ambulatory Visit
Admission: RE | Admit: 2021-07-27 | Discharge: 2021-07-27 | Disposition: A | Discharge status: Home / Self Care | Payer: Medicare HMO | Attending: Gastroenterology | Admitting: Gastroenterology

## 2021-07-27 ENCOUNTER — Encounter: Payer: Self-pay | Admitting: *Deleted

## 2021-07-27 ENCOUNTER — Encounter
Admission: RE | Disposition: A | Discharge status: Home / Self Care | Payer: Self-pay | Source: Home / Self Care | Attending: Gastroenterology

## 2021-07-27 DIAGNOSIS — D120 Benign neoplasm of cecum: Secondary | ICD-10-CM | POA: Insufficient documentation

## 2021-07-27 DIAGNOSIS — D122 Benign neoplasm of ascending colon: Secondary | ICD-10-CM | POA: Insufficient documentation

## 2021-07-27 DIAGNOSIS — M1990 Unspecified osteoarthritis, unspecified site: Secondary | ICD-10-CM | POA: Diagnosis not present

## 2021-07-27 DIAGNOSIS — K449 Diaphragmatic hernia without obstruction or gangrene: Secondary | ICD-10-CM | POA: Diagnosis not present

## 2021-07-27 DIAGNOSIS — Z1211 Encounter for screening for malignant neoplasm of colon: Secondary | ICD-10-CM | POA: Insufficient documentation

## 2021-07-27 DIAGNOSIS — I10 Essential (primary) hypertension: Secondary | ICD-10-CM | POA: Insufficient documentation

## 2021-07-27 DIAGNOSIS — Z800 Family history of malignant neoplasm of digestive organs: Secondary | ICD-10-CM | POA: Diagnosis not present

## 2021-07-27 DIAGNOSIS — F32A Depression, unspecified: Secondary | ICD-10-CM | POA: Insufficient documentation

## 2021-07-27 DIAGNOSIS — K5730 Diverticulosis of large intestine without perforation or abscess without bleeding: Secondary | ICD-10-CM | POA: Diagnosis not present

## 2021-07-27 DIAGNOSIS — E119 Type 2 diabetes mellitus without complications: Secondary | ICD-10-CM | POA: Diagnosis not present

## 2021-07-27 DIAGNOSIS — K219 Gastro-esophageal reflux disease without esophagitis: Secondary | ICD-10-CM | POA: Diagnosis not present

## 2021-07-27 DIAGNOSIS — R1013 Epigastric pain: Secondary | ICD-10-CM | POA: Diagnosis not present

## 2021-07-27 DIAGNOSIS — Q438 Other specified congenital malformations of intestine: Secondary | ICD-10-CM | POA: Insufficient documentation

## 2021-07-27 DIAGNOSIS — F419 Anxiety disorder, unspecified: Secondary | ICD-10-CM | POA: Diagnosis not present

## 2021-07-27 DIAGNOSIS — K640 First degree hemorrhoids: Secondary | ICD-10-CM | POA: Insufficient documentation

## 2021-07-27 HISTORY — PX: COLONOSCOPY WITH PROPOFOL: SHX5780

## 2021-07-27 HISTORY — PX: ESOPHAGOGASTRODUODENOSCOPY (EGD) WITH PROPOFOL: SHX5813

## 2021-07-27 LAB — GLUCOSE, CAPILLARY: Glucose-Capillary: 84 mg/dL (ref 70–99)

## 2021-07-27 SURGERY — COLONOSCOPY WITH PROPOFOL
Anesthesia: General

## 2021-07-27 MED ORDER — PROPOFOL 10 MG/ML IV BOLUS
INTRAVENOUS | Status: DC | PRN
  Administered 2021-07-27: 60 mg via INTRAVENOUS

## 2021-07-27 MED ORDER — PROPOFOL 500 MG/50ML IV EMUL
INTRAVENOUS | Status: DC | PRN
Start: 2021-07-27 — End: 2021-07-27
  Administered 2021-07-27: 100 ug/kg/min via INTRAVENOUS

## 2021-07-27 MED ORDER — LIDOCAINE HCL (CARDIAC) PF 100 MG/5ML IV SOSY
PREFILLED_SYRINGE | INTRAVENOUS | Status: DC | PRN
  Administered 2021-07-27: 100 mg via INTRAVENOUS

## 2021-07-27 MED ORDER — SODIUM CHLORIDE 0.9 % IV SOLN
INTRAVENOUS | Status: DC
  Administered 2021-07-27: 20 mL/h via INTRAVENOUS

## 2021-07-27 NOTE — Anesthesia Postprocedure Evaluation (Signed)
Anesthesia Post Note  Patient: Vanessa Newman  Procedure(s) Performed: COLONOSCOPY WITH PROPOFOL ESOPHAGOGASTRODUODENOSCOPY (EGD) WITH PROPOFOL  Patient location during evaluation: Endoscopy Anesthesia Type: General Level of consciousness: awake and alert Pain management: pain level controlled Vital Signs Assessment: post-procedure vital signs reviewed and stable Respiratory status: spontaneous breathing, nonlabored ventilation, respiratory function stable and patient connected to nasal cannula oxygen Cardiovascular status: blood pressure returned to baseline and stable Postop Assessment: no apparent nausea or vomiting Anesthetic complications: no   No notable events documented.   Last Vitals:  Vitals:   07/27/21 1138 07/27/21 1320  BP: (!) 156/72 96/75  Pulse: 75 78  Resp: 20 18  Temp: (!) 36.4 C 36.7 C  SpO2: 98% 100%    Last Pain:  Vitals:   07/27/21 1320  TempSrc: Temporal  PainSc: Asleep                 Arita Miss

## 2021-07-27 NOTE — Op Note (Signed)
[facility] Gastroenterology Patient Name: Vanessa Newman Procedure Date: 07/27/2021 11:41 AM MRN: [id number] Account #: [id number] Date of Birth: 1941-03-07 Admit Type: Outpatient Age: 81 Room: [facility] ENDO ROOM 3 Gender: Female Note Status: Finalized Instrument Name: Park Meo [id number] Procedure:             Colonoscopy Indications:           Surveillance: Personal history of adenomatous polyps                         on last colonoscopy > 5 years ago Providers:             Andrey Farmer MD, MD Medicines:             Monitored Anesthesia Care Complications:         No immediate complications. Estimated blood loss:                         Minimal. Procedure:             Pre-Anesthesia Assessment:                        - Prior to the procedure, a History and Physical was                         performed, and patient medications and allergies were                         reviewed. The patient is competent. The risks and                         benefits of the procedure and the sedation options and                         risks were discussed with the patient. All questions                         were answered and informed consent was obtained.                         Patient identification and proposed procedure were                         verified by the physician, the nurse, the                         anesthesiologist, the anesthetist and the technician                         in the endoscopy suite. Mental Status Examination:                         alert and oriented. Airway Examination: normal                         oropharyngeal airway and neck mobility. Respiratory                         Examination: clear to auscultation. CV Examination:  normal. Prophylactic Antibiotics: The patient does not                         require prophylactic antibiotics. Prior                         Anticoagulants: The patient has taken no previous                          anticoagulant or antiplatelet agents. ASA Grade                         Assessment: II - A patient with mild systemic disease.                         After reviewing the risks and benefits, the patient                         was deemed in satisfactory condition to undergo the                         procedure. The anesthesia plan was to use monitored                         anesthesia care (MAC). Immediately prior to                         administration of medications, the patient was                         re-assessed for adequacy to receive sedatives. The                         heart rate, respiratory rate, oxygen saturations,                         blood pressure, adequacy of pulmonary ventilation, and                         response to care were monitored throughout the                         procedure. The physical status of the patient was                         re-assessed after the procedure.                        After obtaining informed consent, the colonoscope was                         passed under direct vision. Throughout the procedure,                         the patient's blood pressure, pulse, and oxygen                         saturations were monitored continuously. The  Colonoscope was introduced through the anus and                         advanced to the the cecum, identified by appendiceal                         orifice and ileocecal valve. The colonoscopy was                         somewhat difficult due to a tortuous colon. The                         patient tolerated the procedure well. The quality of                         the bowel preparation was fair. Findings:      The perianal and digital rectal examinations were normal.      A 1 mm polyp was found in the cecum. The polyp was sessile. The polyp       was removed with a jumbo cold forceps. Resection and retrieval were       complete. Estimated blood  loss was minimal.      Two sessile polyps were found in the ascending colon. The polyps were 3       to 6 mm in size. These polyps were removed with a cold snare. Resection       and retrieval were complete. Estimated blood loss was minimal.      Many small and large-mouthed diverticula were found in the sigmoid       colon, descending colon, splenic flexure, transverse colon, hepatic       flexure and ascending colon.      Internal hemorrhoids were found during retroflexion. The hemorrhoids       were Grade I (internal hemorrhoids that do not prolapse).      The exam was otherwise without abnormality on direct and retroflexion       views. Impression:            - Preparation of the colon was fair.                        - One 1 mm polyp in the cecum, removed with a jumbo                         cold forceps. Resected and retrieved.                        - Two 3 to 6 mm polyps in the ascending colon, removed                         with a cold snare. Resected and retrieved.                        - Diverticulosis in the sigmoid colon, in the                         descending colon, at the splenic flexure, in the  transverse colon, at the hepatic flexure and in the                         ascending colon.                        - Internal hemorrhoids.                        - The examination was otherwise normal on direct and                         retroflexion views. Recommendation:        - Discharge patient to home.                        - Resume previous diet.                        - Continue present medications.                        - Await pathology results.                        - Repeat colonoscopy is not recommended due to current                         age (71 years or older) for surveillance.                        - Return to referring physician as previously                         scheduled. Procedure Code(s):     --- Professional ---                         [phone], Colonoscopy, flexible; with removal of                         tumor(s), polyp(s), or other lesion(s) by snare                         technique                        [id number], 41, Colonoscopy, flexible; with biopsy, single                         or multiple Diagnosis Code(s):     --- Professional ---                        K63.5, Polyp of colon                        Z86.010, Personal history of colonic polyps                        K64.0, First degree hemorrhoids                        K57.30, Diverticulosis of  large intestine without                         perforation or abscess without bleeding CPT copyright 2019 American Medical Association. All rights reserved. The codes documented in this report are preliminary and upon coder review may  be revised to meet current compliance requirements. Andrey Farmer MD, MD 07/27/2021 1:22:00 PM Number of Addenda: 0 Note Initiated On: 07/27/2021 11:41 AM Scope Withdrawal Time: 0 hours 11 minutes 40 seconds  Total Procedure Duration: 0 hours 19 minutes 33 seconds  Estimated Blood Loss:  Estimated blood loss was minimal.      [facility]

## 2021-07-27 NOTE — Transfer of Care (Signed)
Immediate Anesthesia Transfer of Care Note  Patient: Vanessa Newman  Procedure(s) Performed: COLONOSCOPY WITH PROPOFOL ESOPHAGOGASTRODUODENOSCOPY (EGD) WITH PROPOFOL  Patient Location: PACU  Anesthesia Type:MAC  Level of Consciousness: awake  Airway & Oxygen Therapy: Patient Spontanous Breathing  Post-op Assessment: Report given to RN  Post vital signs: stable  Last Vitals:  Vitals Value Taken Time  BP    Temp    Pulse    Resp    SpO2      Last Pain:  Vitals:   07/27/21 1138  TempSrc: Temporal  PainSc: 0-No pain         Complications: No notable events documented.

## 2021-07-27 NOTE — Anesthesia Preprocedure Evaluation (Signed)
Anesthesia Evaluation  Patient identified by MRN, date of birth, ID band Patient awake    Reviewed: Allergy & Precautions, NPO status , Patient's Chart, lab work & pertinent test results  History of Anesthesia Complications Negative for: history of anesthetic complications  Airway Mallampati: II  TM Distance: >3 FB Neck ROM: Full    Dental no notable dental hx. (+) Teeth Intact   Pulmonary neg pulmonary ROS, neg sleep apnea, neg COPD, Patient abstained from smoking.Not current smoker,    Pulmonary exam normal breath sounds clear to auscultation       Cardiovascular Exercise Tolerance: Good METShypertension, (-) CAD and (-) Past MI (-) dysrhythmias  Rhythm:Regular Rate:Normal - Systolic murmurs    Neuro/Psych PSYCHIATRIC DISORDERS Anxiety Depression negative neurological ROS     GI/Hepatic GERD  Medicated,(+)     (-) substance abuse  ,   Endo/Other  diabetes  Renal/GU negative Renal ROS     Musculoskeletal   Abdominal   Peds  Hematology   Anesthesia Other Findings Past Medical History: No date: Acid reflux No date: Anxiety and depression No date: Cystocele     Comment:  2nd degree No date: Diabetes mellitus without complication (HCC) No date: Fibroids No date: Hypercholesteremia No date: Hypertension No date: Left lower quadrant pain No date: Melanoma ([place]) No date: Rectocele     Comment:  2nd degree No date: SUI (stress urinary incontinence, female) No date: Urinary incontinence No date: Vaginal atrophy  Reproductive/Obstetrics                             Anesthesia Physical Anesthesia Plan  ASA: 3  Anesthesia Plan: General   Post-op Pain Management: Minimal or no pain anticipated   Induction: Intravenous  PONV Risk Score and Plan: 3 and Propofol infusion, TIVA and Ondansetron  Airway Management Planned: Nasal Cannula  Additional Equipment: None  Intra-op Plan:    Post-operative Plan:   Informed Consent: I have reviewed the patients History and Physical, chart, labs and discussed the procedure including the risks, benefits and alternatives for the proposed anesthesia with the patient or authorized representative who has indicated his/her understanding and acceptance.     Dental advisory given  Plan Discussed with: CRNA and Surgeon  Anesthesia Plan Comments: (Discussed risks of anesthesia with patient, including possibility of difficulty with spontaneous ventilation under anesthesia necessitating airway intervention, PONV, post operative cognitive dysfunction, and rare risks such as cardiac or respiratory or neurological events, and allergic reactions. Discussed the role of CRNA in patient's perioperative care. Patient understands.)        Anesthesia Quick Evaluation

## 2021-07-27 NOTE — H&P (Signed)
Outpatient short stay form Pre-procedure 07/27/2021  [name], MD  Primary Physician: Denton Lank, MD  Reason for visit:  Dyspepsia/Family history of colonoscopy  History of present illness:    81 y/o lady with history of arthritis and GERD here for EGD/Colonoscopy for GERD/Dyspeptic symptoms, weight loss, and early satiety. No blood thinners. No significant abdominal surgeries. Family history of colon cancer in her Father in his 58's.    Current Facility-Administered Medications:    0.9 %  sodium chloride infusion, , Intravenous, Continuous, Kristofer Schaffert, Hilton Cork, MD, Last Rate: 20 mL/hr at 07/27/21 1153, 20 mL/hr at 07/27/21 1153  Medications Prior to Admission  Medication Sig Dispense Refill Last Dose   arformoterol (BROVANA) 15 MCG/2ML NEBU Take 2 mLs (15 mcg total) by nebulization in the morning and at bedtime. 120 mL 11 07/26/2021   ARIPiprazole (ABILIFY) 2 MG tablet Take 2 mg by mouth daily.   07/26/2021   aspirin 81 MG tablet Take 81 mg by mouth daily.   Past Week   atorvastatin (LIPITOR) 40 MG tablet Take 40 mg by mouth daily.   07/26/2021   citalopram (CELEXA) 20 MG tablet Take 20 mg by mouth daily.    07/26/2021   estrogens, conjugated, (PREMARIN) 0.625 MG tablet Take 0.625 mg by mouth daily. Take daily for 21 days then do not take for 7 days.   07/26/2021   fluticasone furoate-vilanterol (BREO ELLIPTA) 100-25 MCG/ACT AEPB Inhale 1 puff into the lungs daily.   07/26/2021   gabapentin (NEURONTIN) 800 MG tablet Take 800 mg by mouth 3 (three) times daily.    07/26/2021   HYDROcodone-acetaminophen (NORCO/VICODIN) 5-325 MG tablet Take 1 tablet by mouth every 6 (six) hours as needed for moderate pain.   07/26/2021   ibuprofen (ADVIL,MOTRIN) 600 MG tablet Take 600 mg by mouth every 8 (eight) hours as needed.   Past Week   meloxicam (MOBIC) 15 MG tablet Take 15 mg by mouth daily.   07/26/2021   omeprazole (PRILOSEC) 20 MG capsule Take 20 mg by mouth daily.   07/26/2021   ondansetron  (ZOFRAN) 4 MG tablet Take 1 tablet (4 mg total) by mouth every 6 (six) hours. 12 tablet 0 Past Week   rOPINIRole (REQUIP) 0.5 MG tablet Take 0.5 mg by mouth 3 (three) times daily.   Past Week   senna (SENOKOT) 8.6 MG TABS tablet Take 1 tablet by mouth.   Past Week   Spacer/Aero-Holding Chambers (AEROCHAMBER MV) inhaler Use as instructed 1 each 0 Past Week   triamterene-hydrochlorothiazide (DYAZIDE) 37.5-25 MG capsule Take 1 capsule by mouth daily.   07/26/2021   triamterene-hydrochlorothiazide (MAXZIDE-25) 37.5-25 MG per tablet Take 1 tablet by mouth daily.   07/26/2021   vitamin C (ASCORBIC ACID) 250 MG tablet Take 250 mg by mouth daily.   07/26/2021   Vitamin D, Cholecalciferol, 400 UNITS CHEW Chew 400 Units by mouth daily.   07/26/2021   Albuterol Sulfate (PROAIR HFA IN) Inhale into the lungs. (Patient not taking: Reported on 05/23/2020)      budesonide (PULMICORT) 0.25 MG/2ML nebulizer solution Take 2 mLs (0.25 mg total) by nebulization 2 (two) times daily. 120 mL 11    calcium carbonate (OS-CAL) 600 MG TABS tablet Take 600 mg by mouth daily with breakfast.      Multiple Vitamins-Minerals (EYE VITAMINS & MINERALS PO) Take by mouth.      tiZANidine (ZANAFLEX) 2 MG tablet Take 1 tablet (2 mg total) by mouth at bedtime. 20 tablet 0  Allergies  Allergen Reactions   Prednisone Anxiety    Pt reports it makes her have suicidal thoughts     Past Medical History:  Diagnosis Date   Acid reflux    Anxiety and depression    Cystocele    2nd degree   Diabetes mellitus without complication (HCC)    Fibroids    Hypercholesteremia    Hypertension    Left lower quadrant pain    Melanoma (HCC)    Rectocele    2nd degree   SUI (stress urinary incontinence, female)    Urinary incontinence    Vaginal atrophy     Review of systems:  Otherwise negative.    Physical Exam  Gen: Alert, oriented. Appears stated age.  HEENT: PERRLA. Lungs: No respiratory distress CV: RRR Abd: soft, benign,  no masses Ext: No edema    Planned procedures: Proceed with EGD/colonoscopy. The patient understands the nature of the planned procedure, indications, risks, alternatives and potential complications including but not limited to bleeding, infection, perforation, damage to internal organs and possible oversedation/side effects from anesthesia. The patient agrees and gives consent to proceed.  Please refer to procedure notes for findings, recommendations and patient disposition/instructions.     [name], MD [facility] Gastroenterology

## 2021-07-27 NOTE — Interval H&P Note (Signed)
History and Physical Interval Note:  07/27/2021 12:28 PM  Vanessa Newman  has presented today for surgery, with the diagnosis of K21.9 - Gastroesophageal reflux disease, unspecified whether esophagitis present R11.0 - Nausea R68.81  - Early satiety R63.4  - Weight loss Z80.0  - Family history of colon cancer.  The various methods of treatment have been discussed with the patient and family. After consideration of risks, benefits and other options for treatment, the patient has consented to  Procedure(s): COLONOSCOPY WITH PROPOFOL (N/A) ESOPHAGOGASTRODUODENOSCOPY (EGD) WITH PROPOFOL (N/A) as a surgical intervention.  The patient's history has been reviewed, patient examined, no change in status, stable for surgery.  I have reviewed the patient's chart and labs.  Questions were answered to the patient's satisfaction.     [name]  Ok to proceed with EGD/Colonoscopy

## 2021-07-27 NOTE — Op Note (Signed)
[facility] Gastroenterology Patient Name: Vanessa Newman Procedure Date: 07/27/2021 11:46 AM MRN: [id number] Account #: [id number] Date of Birth: 09-11-1940 Admit Type: Outpatient Age: 81 Room: [facility] ENDO ROOM 3 Gender: Female Note Status: Finalized Instrument Name: Upper Endoscope [id number] Procedure:             Upper GI endoscopy Indications:           Dyspepsia Providers:             Andrey Farmer MD, MD Medicines:             Monitored Anesthesia Care Complications:         No immediate complications. Procedure:             Pre-Anesthesia Assessment:                        - Prior to the procedure, a History and Physical was                         performed, and patient medications and allergies were                         reviewed. The patient is competent. The risks and                         benefits of the procedure and the sedation options and                         risks were discussed with the patient. All questions                         were answered and informed consent was obtained.                         Patient identification and proposed procedure were                         verified by the physician, the nurse, the                         anesthesiologist, the anesthetist and the technician                         in the endoscopy suite. Mental Status Examination:                         alert and oriented. Airway Examination: normal                         oropharyngeal airway and neck mobility. Respiratory                         Examination: clear to auscultation. CV Examination:                         normal. Prophylactic Antibiotics: The patient does not                         require prophylactic antibiotics. Prior  Anticoagulants: The patient has taken no previous                         anticoagulant or antiplatelet agents. ASA Grade                         Assessment: II - A patient with mild systemic  disease.                         After reviewing the risks and benefits, the patient                         was deemed in satisfactory condition to undergo the                         procedure. The anesthesia plan was to use monitored                         anesthesia care (MAC). Immediately prior to                         administration of medications, the patient was                         re-assessed for adequacy to receive sedatives. The                         heart rate, respiratory rate, oxygen saturations,                         blood pressure, adequacy of pulmonary ventilation, and                         response to care were monitored throughout the                         procedure. The physical status of the patient was                         re-assessed after the procedure.                        After obtaining informed consent, the endoscope was                         passed under direct vision. Throughout the procedure,                         the patient's blood pressure, pulse, and oxygen                         saturations were monitored continuously. The                         Endosonoscope was introduced through the mouth, and                         advanced to the second part of duodenum. The upper GI  endoscopy was technically difficult and complex due to                         the patient's oxygen desaturation. Successful                         completion of the procedure was aided by managing the                         patient's medical instability. The patient tolerated                         the procedure well. Findings:      A large hiatal hernia was present.      No gross lesions were noted in the stomach.      The examined duodenum was normal. Impression:            - Large hiatal hernia.                        - No gross lesions in the stomach.                        - Normal examined duodenum.                        -  No specimens collected. Recommendation:        - Perform a colonoscopy today. A close examination of                         the stomach was unable to be done due to oxygen                         desaturation but no gross lesions seen. If persistent                         symptoms can consider repeat EGD. Procedure Code(s):     --- Professional ---                        [phone], Esophagogastroduodenoscopy, flexible,                         transoral; diagnostic, including collection of                         specimen(s) by brushing or washing, when performed                         (separate procedure) Diagnosis Code(s):     --- Professional ---                        K44.9, Diaphragmatic hernia without obstruction or                         gangrene                        R10.13, Epigastric pain CPT copyright 2019 [employer]. All rights reserved. The codes documented in this report  are preliminary and upon coder review may  be revised to meet current compliance requirements. Andrey Farmer MD, MD 07/27/2021 1:18:50 PM Number of Addenda: 0 Note Initiated On: 07/27/2021 11:46 AM Estimated Blood Loss:  Estimated blood loss: none.      [facility]

## 2021-07-30 ENCOUNTER — Encounter: Payer: Self-pay | Admitting: Gastroenterology

## 2021-07-30 LAB — SURGICAL PATHOLOGY

## 2021-10-08 ENCOUNTER — Ambulatory Visit: Payer: Medicare HMO | Admitting: Surgery

## 2021-10-22 ENCOUNTER — Other Ambulatory Visit: Payer: Self-pay | Admitting: Family Medicine

## 2021-10-22 DIAGNOSIS — Z1231 Encounter for screening mammogram for malignant neoplasm of breast: Secondary | ICD-10-CM

## 2021-11-27 DIAGNOSIS — K449 Diaphragmatic hernia without obstruction or gangrene: Secondary | ICD-10-CM | POA: Insufficient documentation

## 2021-11-27 DIAGNOSIS — R7303 Prediabetes: Secondary | ICD-10-CM | POA: Insufficient documentation

## 2021-12-17 IMAGING — MR MR LUMBAR SPINE W/O CM
5 series · 31 of 48 positions shown · non-contrast
Comparison: 10/05/2009

CLINICAL DATA: Low back pain radiating to the left leg

EXAM:
MRI LUMBAR SPINE WITHOUT CONTRAST
TECHNIQUE: Multiplanar, multisequence MR imaging of the lumbar spine was
performed. No intravenous contrast was administered.

[Series 5: T2 · sagittal · 4.0mm · 0.81mm/px · 6 of 17 slices shown (1 of 2)]
[im 1/17]
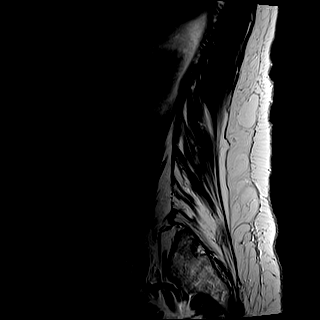
[im 4/17]
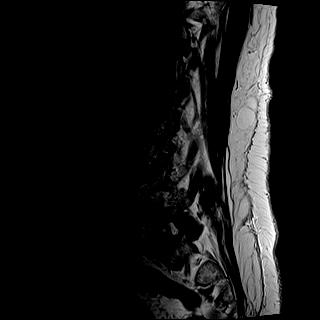
[im 7/17]
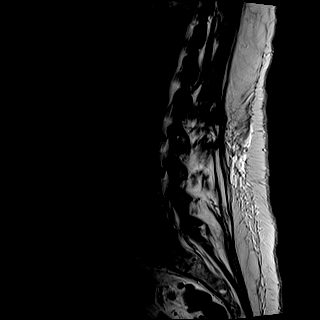
[im 10/17]
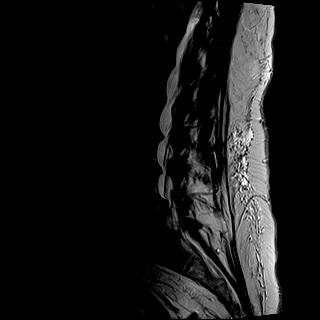
[im 13/17]
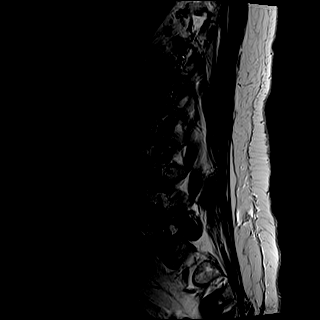
[im 17/17]
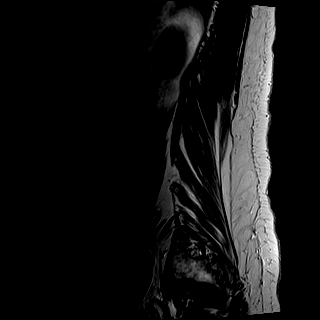

[Series 6: T1 · sagittal · 4.0mm · 0.81mm/px · 7 of 17 slices shown (1 of 2)]
[im 1/17]
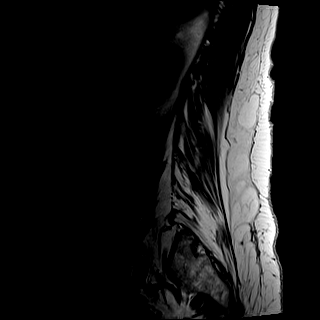
[im 3/17]
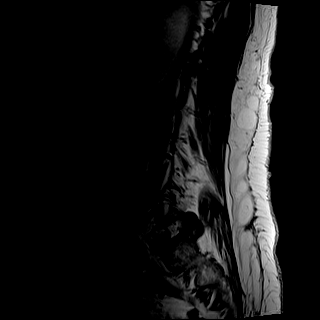
[im 6/17]
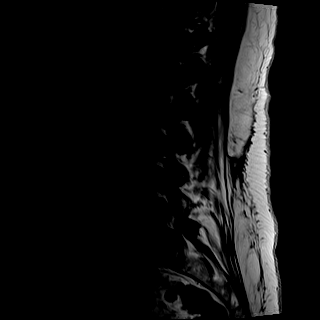
[im 9/17]
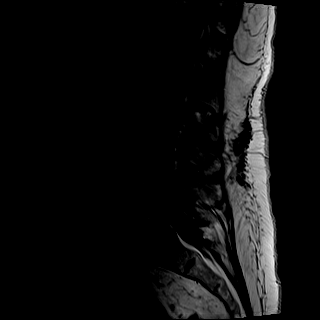
[im 11/17]
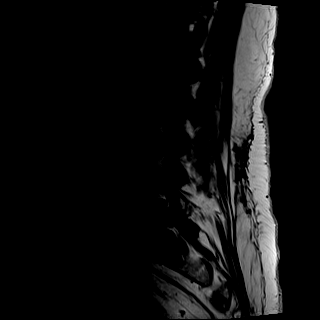
[im 14/17]
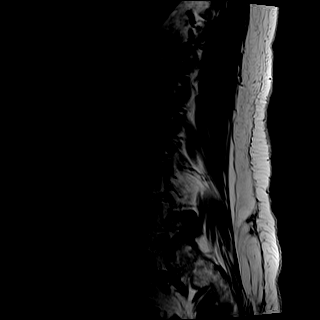
[im 17/17]
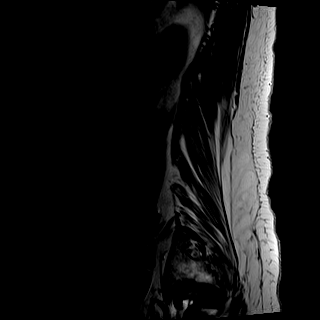

[Series 7: STIR · sagittal · 4.0mm · 0.41mm/px · 2 of 17 slices shown]
[im 1/17]
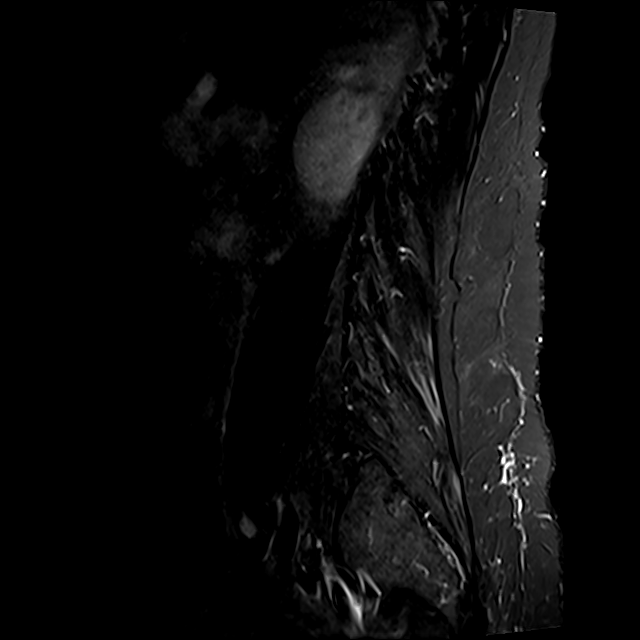
[im 3/17]
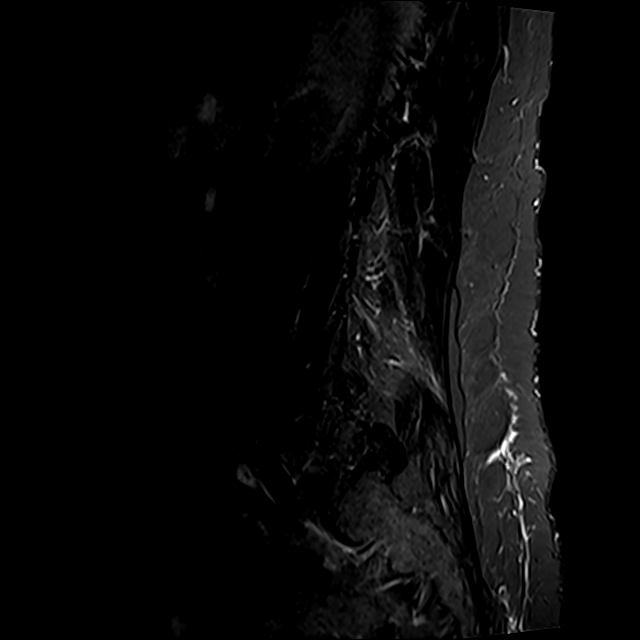

[Series 8: T2 · axial · 4.0mm · 0.78mm/px · z∈[-91,+111]mm · 8 of 34 slices shown (2 of 2)]
[im 1/34]
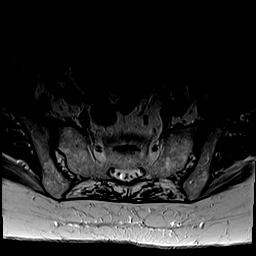
[im 6/34]
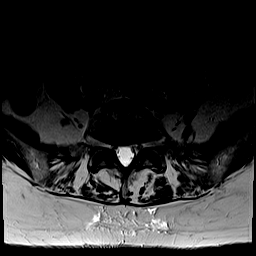
[im 11/34]
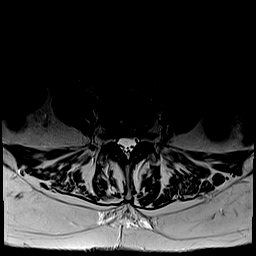
[im 16/34]
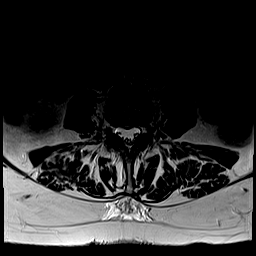
[im 18/34]
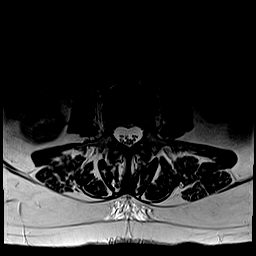
[im 23/34]
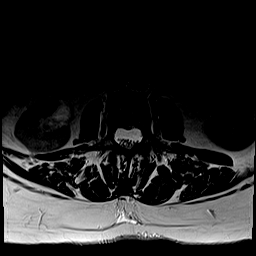
[im 28/34]
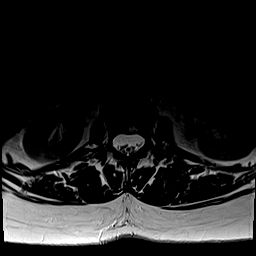
[im 34/34]
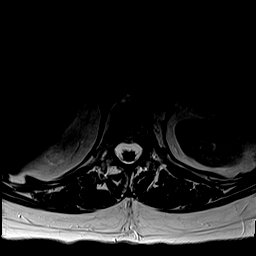

[Series 9: T1 · axial · 4.0mm · 0.39mm/px · z∈[-91,+111]mm · 8 of 34 slices shown (2 of 2)]
[im 1/34]
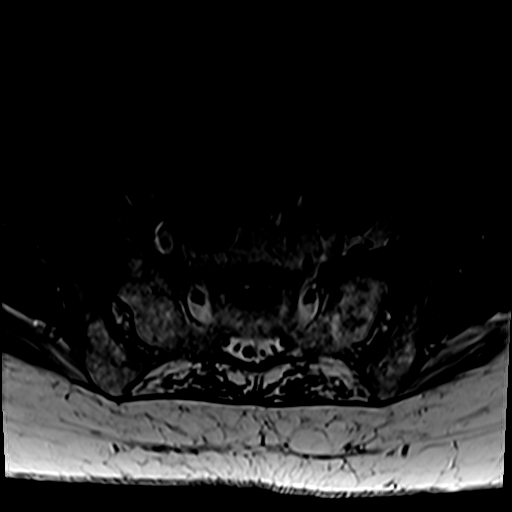
[im 6/34]
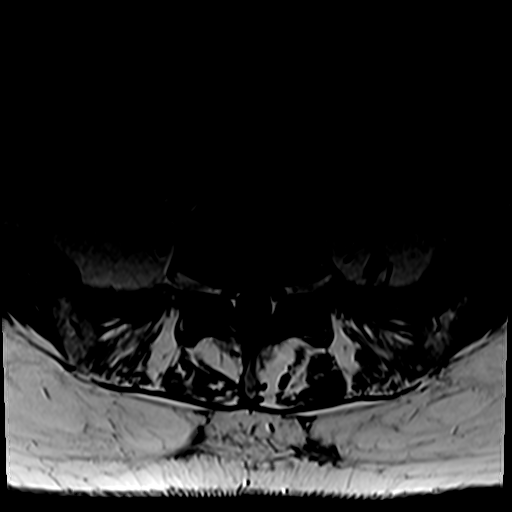
[im 11/34]
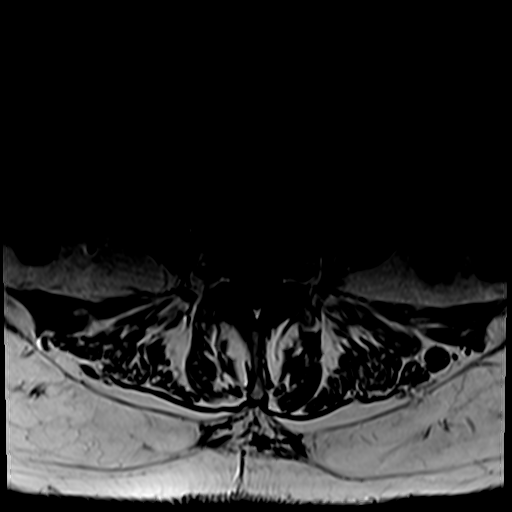
[im 16/34]
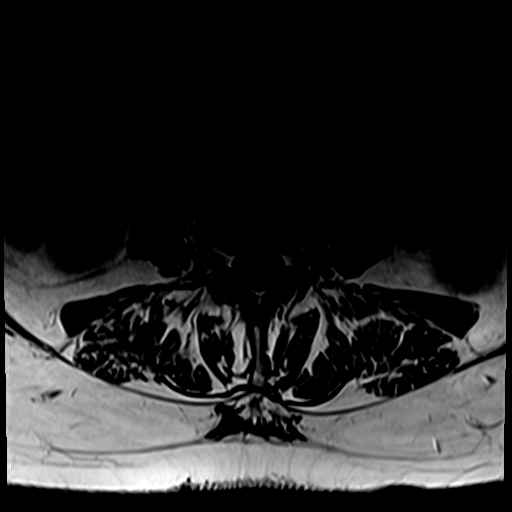
[im 18/34]
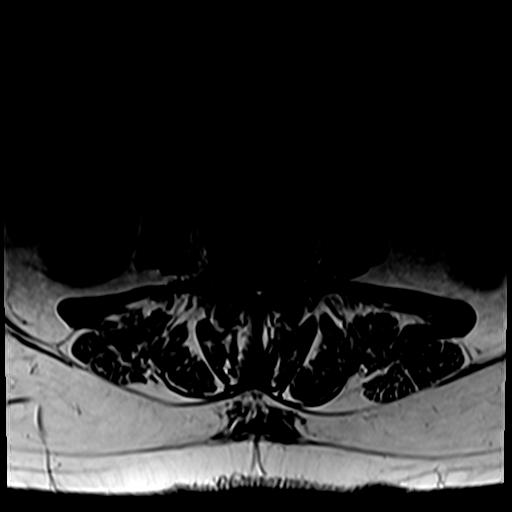
[im 23/34]
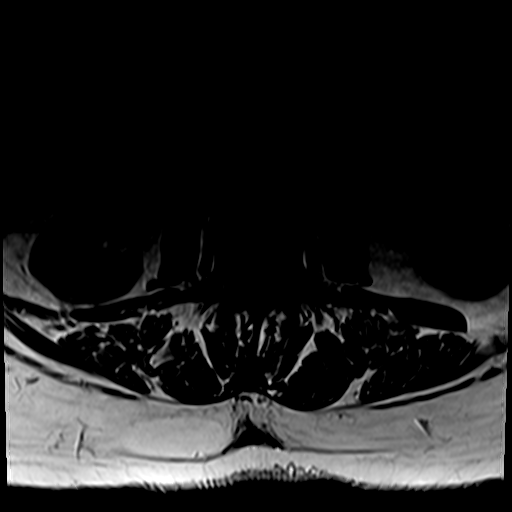
[im 28/34]
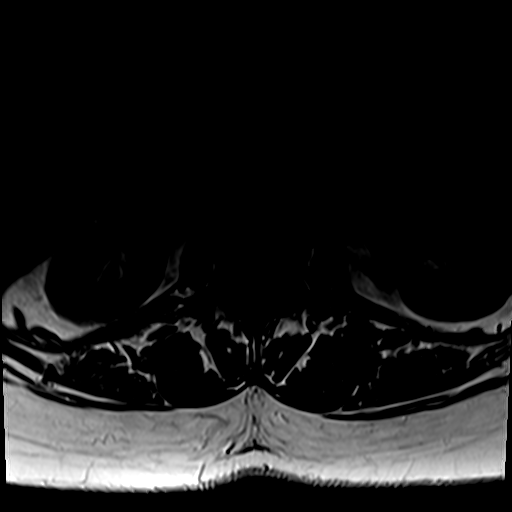
[im 34/34]
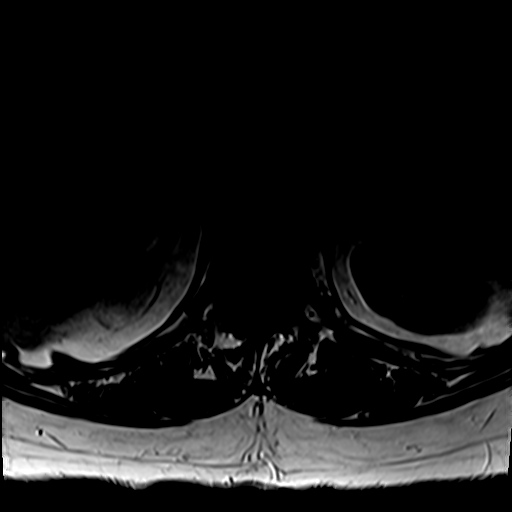

[31 of 48 positions shown; findings below may reference images not displayed]

FINDINGS: Segmentation:  Standard

Alignment:  Grade 1 retrolisthesis at L2-3

Vertebrae: Interbody fusion at L3-4 and L4-5. No acute abnormality.
No fracture or discitis-osteomyelitis.

Conus medullaris and cauda equina: Conus extends to the L1 level.
Conus and cauda equina appear normal.

Paraspinal and other soft tissues: Negative.

Disc levels:

T12-L1: Minimal disc bulge without stenosis.

L1-L2: No disc herniation. Mild facet hypertrophy. No spinal canal
stenosis. No neural foraminal stenosis.

L2-L3: Disc space narrowing with small bulge. Normal facets. No
spinal canal stenosis. No neural foraminal stenosis.

L3-L4: Interbody spacer with improved patency of the spinal canal.
Disc space narrowing with small bulge and mild facet hypertrophy.
Narrowing of both lateral recesses without central spinal canal
stenosis. No neural foraminal stenosis.

L4-L5: Interbody spacer with improved patency of the spinal.
Moderate facet hypertrophy and small disc bulge. Narrowing of both
lateral recesses without central spinal canal stenosis. No neural
foraminal stenosis.

L5-S1: Moderate facet hypertrophy. Disc space narrowing. No spinal
canal stenosis. No neural foraminal stenosis.

Visualized sacrum: Normal.
IMPRESSION: 1. Interbody fusion at L3-4 and L4-5 with improved patency of the
spinal canal. Mild narrowing of the lateral recesses at both levels.
2. No central spinal canal or neural foraminal stenosis.
3. Moderate facet arthrosis at L4-5 and L5-S[DATE] be a source of
local low back pain.

## 2022-01-10 ENCOUNTER — Ambulatory Visit
Admission: RE | Admit: 2022-01-10 | Discharge: 2022-01-10 | Disposition: A | Discharge status: Home / Self Care | Payer: Medicare HMO | Source: Ambulatory Visit | Attending: Family Medicine | Admitting: Family Medicine

## 2022-01-10 DIAGNOSIS — Z1231 Encounter for screening mammogram for malignant neoplasm of breast: Secondary | ICD-10-CM | POA: Insufficient documentation

## 2022-07-05 ENCOUNTER — Other Ambulatory Visit: Payer: Self-pay | Admitting: Family Medicine

## 2022-07-05 DIAGNOSIS — N644 Mastodynia: Secondary | ICD-10-CM

## 2022-07-12 ENCOUNTER — Ambulatory Visit
Admission: RE | Admit: 2022-07-12 | Discharge: 2022-07-12 | Disposition: A | Discharge status: Home / Self Care | Payer: Medicare HMO | Source: Ambulatory Visit | Attending: Family Medicine | Admitting: Family Medicine

## 2022-07-12 DIAGNOSIS — N644 Mastodynia: Secondary | ICD-10-CM | POA: Insufficient documentation

## 2022-07-22 ENCOUNTER — Other Ambulatory Visit: Payer: Self-pay | Admitting: Family Medicine

## 2022-07-22 DIAGNOSIS — Z780 Asymptomatic menopausal state: Secondary | ICD-10-CM

## 2022-08-07 ENCOUNTER — Ambulatory Visit
Admission: EM | Admit: 2022-08-07 | Discharge: 2022-08-07 | Disposition: A | Discharge status: Home / Self Care | Payer: Medicare HMO | Attending: Family Medicine | Admitting: Family Medicine

## 2022-08-07 DIAGNOSIS — M25552 Pain in left hip: Secondary | ICD-10-CM

## 2022-08-07 MED ORDER — DEXAMETHASONE SODIUM PHOSPHATE 10 MG/ML IJ SOLN
10.0000 mg | Freq: Once | INTRAMUSCULAR | Status: AC
  Administered 2022-08-07: 10 mg via INTRAMUSCULAR

## 2022-08-07 NOTE — ED Triage Notes (Signed)
Pt reports left side hip pain x 4 days.

## 2022-08-07 NOTE — Discharge Instructions (Signed)
Perform stretches regularly, and you may use warm Epsom salt baths, heating pads, massage, Tylenol as needed.  We have given you a steroid shot today to help with this current flareup.  Follow-up with your primary care or orthopedist if not resolving or returning

## 2022-08-07 NOTE — ED Provider Notes (Signed)
[facility]    CSN: [id number] Arrival date & time: 08/07/22  1200      History   Chief Complaint No chief complaint on file.   HPI Vanessa Newman is a 82 y.o. female.   Patient presenting today with 4-day history of left posterior buttock and hip pain extending down to the base of her left buttock.  She states prior to onset she was doing a lot of heavy lifting and moving of things and thinks that she overdid it.  Denies radiation of pain down the leg, numbness, tingling, weakness, bowel or bladder incontinence, saddle anesthesias.  States she is awaiting a hip replacement within the next year on this side for arthritis.  Trying Tylenol, heat with minimal relief.    Past Medical History:  Diagnosis Date   Acid reflux    Anxiety and depression    Cystocele    2nd degree   Diabetes mellitus without complication (HCC)    Fibroids    Hypercholesteremia    Hypertension    Left lower quadrant pain    Melanoma (HCC)    Rectocele    2nd degree   SUI (stress urinary incontinence, female)    Urinary incontinence    Vaginal atrophy     Patient Active Problem List   Diagnosis Date Noted   History of chest pain 08/04/2018   Mixed incontinence urge and stress 05/30/2015   Exertional dyspnea 01/12/2014   Chest pain 01/12/2014   Personal history of other malignant neoplasm of skin 09/27/2013   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   Synovitis 09/09/2013   Myofascial muscle pain 09/09/2013   Lumbar spinal stenosis 09/09/2013   Hyperlipidemia, unspecified 09/09/2013   HTN (hypertension) 09/09/2013   GERD (gastroesophageal reflux disease) 09/09/2013   Depression 09/09/2013    Past Surgical History:  Procedure Laterality Date   COLONOSCOPY WITH PROPOFOL N/A 07/27/2021   Procedure: COLONOSCOPY WITH PROPOFOL;  Surgeon: [name], MD;  Location: [facility] ENDOSCOPY;  Service: Endoscopy;  Laterality: N/A;   ESOPHAGOGASTRODUODENOSCOPY (EGD) WITH PROPOFOL N/A 07/27/2021    Procedure: ESOPHAGOGASTRODUODENOSCOPY (EGD) WITH PROPOFOL;  Surgeon: [name], MD;  Location: [facility] ENDOSCOPY;  Service: Endoscopy;  Laterality: N/A;   right knee replacement Right 2010   TOTAL HIP ARTHROPLASTY Right 2013    OB History     Gravida  4   Para  3   Term      Preterm      AB  1   Living  3      SAB  1   IAB      Ectopic      Multiple      Live Births               Home Medications    Prior to Admission medications   Medication Sig Start Date End Date Taking? Authorizing Provider  Albuterol Sulfate (PROAIR HFA IN) Inhale into the lungs. Patient not taking: Reported on 05/23/2020    [provider]  arformoterol (BROVANA) 15 MCG/2ML NEBU Take 2 mLs (15 mcg total) by nebulization in the morning and at bedtime. 05/23/20   Tyler Pita, MD  ARIPiprazole (ABILIFY) 2 MG tablet Take 2 mg by mouth daily.    [provider]  aspirin 81 MG tablet Take 81 mg by mouth daily.    [provider]  atorvastatin (LIPITOR) 40 MG tablet Take 40 mg by mouth daily.    [provider]  budesonide (PULMICORT) 0.25  MG/2ML nebulizer solution Take 2 mLs (0.25 mg total) by nebulization 2 (two) times daily. 05/25/20 05/25/21  Tyler Pita, MD  calcium carbonate (OS-CAL) 600 MG TABS tablet Take 600 mg by mouth daily with breakfast.    [provider]  citalopram (CELEXA) 20 MG tablet Take 20 mg by mouth daily.     [provider]  estrogens, conjugated, (PREMARIN) 0.625 MG tablet Take 0.625 mg by mouth daily. Take daily for 21 days then do not take for 7 days.    [provider]  fluticasone furoate-vilanterol (BREO ELLIPTA) 100-25 MCG/ACT AEPB Inhale 1 puff into the lungs daily.    [provider]  gabapentin (NEURONTIN) 800 MG tablet Take 800 mg by mouth 3 (three) times daily.     [provider]  HYDROcodone-acetaminophen (NORCO/VICODIN) 5-325 MG tablet Take 1 tablet by mouth  every 6 (six) hours as needed for moderate pain.    [provider]  ibuprofen (ADVIL,MOTRIN) 600 MG tablet Take 600 mg by mouth every 8 (eight) hours as needed.    [provider]  meloxicam (MOBIC) 15 MG tablet Take 15 mg by mouth daily.    [provider]  Multiple Vitamins-Minerals (EYE VITAMINS & MINERALS PO) Take by mouth.    [provider]  omeprazole (PRILOSEC) 20 MG capsule Take 20 mg by mouth daily.    [provider]  ondansetron (ZOFRAN) 4 MG tablet Take 1 tablet (4 mg total) by mouth every 6 (six) hours. 12/09/20   Wurst, [place], PA-C  rOPINIRole (REQUIP) 0.5 MG tablet Take 0.5 mg by mouth 3 (three) times daily.    [provider]  senna (SENOKOT) 8.6 MG TABS tablet Take 1 tablet by mouth.    [provider]  Spacer/Aero-Holding Chambers (AEROCHAMBER MV) inhaler Use as instructed 05/25/19   Tyler Pita, MD  tiZANidine (ZANAFLEX) 2 MG tablet Take 1 tablet (2 mg total) by mouth at bedtime. 10/11/20   Wurst, [place], PA-C  triamterene-hydrochlorothiazide (DYAZIDE) 37.5-25 MG capsule Take 1 capsule by mouth daily.    [provider]  triamterene-hydrochlorothiazide (MAXZIDE-25) 37.5-25 MG per tablet Take 1 tablet by mouth daily.    [provider]  vitamin C (ASCORBIC ACID) 250 MG tablet Take 250 mg by mouth daily.    [provider]  Vitamin D, Cholecalciferol, 400 UNITS CHEW Chew 400 Units by mouth daily.    [provider]    Family History Family History  Problem Relation Age of Onset   Breast cancer Mother 29   Colon cancer Father    Cancer Father        lung    Social History Social History   Tobacco Use   Smoking status: Never   Smokeless tobacco: Never  Substance Use Topics   Alcohol use: No   Drug use: No     Allergies   Prednisone   Review of Systems Review of Systems Per HPI  Physical Exam Triage Vital Signs ED Triage Vitals  Enc Vitals Group      BP 08/07/22 1305 (!) 160/70     Pulse Rate 08/07/22 1305 71     Resp 08/07/22 1305 16     Temp 08/07/22 1305 97.8 F (36.6 C)     Temp Source 08/07/22 1305 Oral     SpO2 08/07/22 1305 98 %     Weight --      Height --      Head Circumference --  Peak Flow --      Pain Score 08/07/22 1308 8     Pain Loc --      Pain Edu? --      Excl. in [place]? --    No data found.  Updated Vital Signs BP (!) 160/70 (BP Location: Right Arm)   Pulse 71   Temp 97.8 F (36.6 C) (Oral)   Resp 16   SpO2 98%   Visual Acuity Right Eye Distance:   Left Eye Distance:   Bilateral Distance:    Right Eye Near:   Left Eye Near:    Bilateral Near:     Physical Exam Vitals and nursing note reviewed.  Constitutional:      Appearance: Normal appearance. She is not ill-appearing.  HENT:     Head: Atraumatic.  Eyes:     Extraocular Movements: Extraocular movements intact.     Conjunctiva/sclera: Conjunctivae normal.  Cardiovascular:     Rate and Rhythm: Normal rate and regular rhythm.     Heart sounds: Normal heart sounds.  Pulmonary:     Effort: Pulmonary effort is normal.     Breath sounds: Normal breath sounds.  Musculoskeletal:        General: Tenderness present. No swelling or deformity. Normal range of motion.     Cervical back: Normal range of motion and neck supple.     Comments: No midline spinal tenderness to palpation diffusely.  Good range of motion and normal gait diffusely.  Left posterior buttock and lateral left hip tender to palpation.  Negative straight leg raise bilateral lower extremities  Skin:    General: Skin is warm and dry.     Findings: No bruising or erythema.  Neurological:     Mental Status: She is alert and oriented to person, place, and time.     Motor: No weakness.     Gait: Gait normal.     Comments: Left lower extremity neurovascularly intact  Psychiatric:        Mood and Affect: Mood normal.        Thought Content: Thought content normal.         Judgment: Judgment normal.      UC Treatments / Results  Labs (all labs ordered are listed, but only abnormal results are displayed) Labs Reviewed - No data to display  EKG   Radiology No results found.  Procedures Procedures (including critical care time)  Medications Ordered in UC Medications  dexamethasone (DECADRON) injection 10 mg (10 mg Intramuscular Given 08/07/22 1327)    Initial Impression / Assessment and Plan / UC Course  I have reviewed the triage vital signs and the nursing notes.  Pertinent labs & imaging results that were available during my care of the patient were reviewed by me and considered in my medical decision making (see chart for details).     Vitals and exam overall very reassuring, will treat with IM Decadron, ongoing supportive home care and over-the-counter pain relievers.  Close follow-up with orthopedics recommended.  Final Clinical Impressions(s) / UC Diagnoses   Final diagnoses:  Acute hip pain, left     Discharge Instructions      Perform stretches regularly, and you may use warm Epsom salt baths, heating pads, massage, Tylenol as needed.  We have given you a steroid shot today to help with this current flareup.  Follow-up with your primary care or orthopedist if not resolving or returning    ED Prescriptions   None  PDMP not reviewed this encounter.   Volney American, [place] 08/07/22 1334

## 2022-09-04 ENCOUNTER — Other Ambulatory Visit: Payer: Medicare HMO

## 2022-09-18 ENCOUNTER — Other Ambulatory Visit: Payer: Medicare HMO

## 2022-11-29 ENCOUNTER — Ambulatory Visit
Admission: RE | Admit: 2022-11-29 | Discharge: 2022-11-29 | Disposition: A | Discharge status: Home / Self Care | Payer: Medicare HMO | Attending: Family Medicine | Admitting: Family Medicine

## 2022-11-29 ENCOUNTER — Ambulatory Visit
Admission: RE | Admit: 2022-11-29 | Discharge: 2022-11-29 | Disposition: A | Discharge status: Home / Self Care | Payer: Medicare HMO | Source: Ambulatory Visit | Attending: Family Medicine | Admitting: Family Medicine

## 2022-11-29 ENCOUNTER — Other Ambulatory Visit: Payer: Self-pay | Admitting: Family Medicine

## 2022-11-29 DIAGNOSIS — R1013 Epigastric pain: Secondary | ICD-10-CM | POA: Diagnosis not present

## 2022-11-29 DIAGNOSIS — K449 Diaphragmatic hernia without obstruction or gangrene: Secondary | ICD-10-CM

## 2022-12-10 ENCOUNTER — Ambulatory Visit
Admission: EM | Admit: 2022-12-10 | Discharge: 2022-12-10 | Disposition: A | Discharge status: Home / Self Care | Payer: Medicare HMO | Attending: Nurse Practitioner | Admitting: Nurse Practitioner

## 2022-12-10 ENCOUNTER — Encounter: Payer: Self-pay | Admitting: Emergency Medicine

## 2022-12-10 DIAGNOSIS — M1612 Unilateral primary osteoarthritis, left hip: Secondary | ICD-10-CM | POA: Diagnosis not present

## 2022-12-10 MED ORDER — DICLOFENAC SODIUM 1 % EX GEL: 2.0000 g | Freq: Four times a day (QID) | CUTANEOUS | 0 refills | Status: AC

## 2022-12-10 NOTE — ED Triage Notes (Signed)
Left hip pain x 3 days.  Scheduled to have a hip replacement in 6 weeks.

## 2022-12-10 NOTE — ED Provider Notes (Signed)
[facility]    CSN: [id number] Arrival date & time: 12/10/22  4098      History   Chief Complaint No chief complaint on file.   HPI Vanessa Newman is a 82 y.o. female.   Patient presents today for ongoing left hip pain.  Reports she has known osteoarthritis of the left hip and has a hip replacement scheduled for next month.  Reports she was taking meloxicam, but PCP recently took her off this medicine due to kidney function.  Reports worsening pain for the last 3 days.  No recent fall, accident, or known trauma to the left hip.  No numbness or tingling going down the leg, weakness with weightbearing or walking, swelling, redness, or bruising.  No fevers or nausea/vomiting since the pain worsened.  She reports the pain is in her left hip and goes all the way down to her toes and this is normal for her hip pain.    Past Medical History:  Diagnosis Date   Acid reflux    Anxiety and depression    Cystocele    2nd degree   Diabetes mellitus without complication (HCC)    Fibroids    Hypercholesteremia    Hypertension    Left lower quadrant pain    Melanoma (HCC)    Rectocele    2nd degree   SUI (stress urinary incontinence, female)    Urinary incontinence    Vaginal atrophy     Patient Active Problem List   Diagnosis Date Noted   History of chest pain 08/04/2018   Mixed incontinence urge and stress 05/30/2015   Exertional dyspnea 01/12/2014   Chest pain 01/12/2014   Personal history of other malignant neoplasm of skin 09/27/2013   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   Synovitis 09/09/2013   Myofascial muscle pain 09/09/2013   Lumbar spinal stenosis 09/09/2013   Hyperlipidemia, unspecified 09/09/2013   HTN (hypertension) 09/09/2013   GERD (gastroesophageal reflux disease) 09/09/2013   Depression 09/09/2013    Past Surgical History:  Procedure Laterality Date   COLONOSCOPY WITH PROPOFOL N/A 07/27/2021   Procedure: COLONOSCOPY WITH PROPOFOL;  Surgeon: Regis Bill, MD;  Location: [facility];  Service: Endoscopy;  Laterality: N/A;   ESOPHAGOGASTRODUODENOSCOPY (EGD) WITH PROPOFOL N/A 07/27/2021   Procedure: ESOPHAGOGASTRODUODENOSCOPY (EGD) WITH PROPOFOL;  Surgeon: Regis Bill, MD;  Location: [facility];  Service: Endoscopy;  Laterality: N/A;   right knee replacement Right 2010   TOTAL HIP ARTHROPLASTY Right 2013    OB History     Gravida  4   Para  3   Term      Preterm      AB  1   Living  3      SAB  1   IAB      Ectopic      Multiple      Live Births               Home Medications    Prior to Admission medications   Medication Sig Start Date End Date Taking? Authorizing Provider  diclofenac Sodium (VOLTAREN) 1 % GEL Apply 2 g topically 4 (four) times daily. 12/10/22  Yes Valentino Nose, NP  Albuterol Sulfate (PROAIR HFA IN) Inhale into the lungs. Patient not taking: Reported on 05/23/2020    [provider]  arformoterol (BROVANA) 15 MCG/2ML NEBU Take 2 mLs (15 mcg total) by nebulization in the morning and at bedtime. 05/23/20   Salena Saner, MD  ARIPiprazole (  ABILIFY) 2 MG tablet Take 2 mg by mouth daily.    [provider]  atorvastatin (LIPITOR) 40 MG tablet Take 40 mg by mouth daily.    [provider]  budesonide (PULMICORT) 0.25 MG/2ML nebulizer solution Take 2 mLs (0.25 mg total) by nebulization 2 (two) times daily. 05/25/20 05/25/21  Salena Saner, MD  calcium carbonate (OS-CAL) 600 MG TABS tablet Take 600 mg by mouth daily with breakfast.    [provider]  citalopram (CELEXA) 20 MG tablet Take 20 mg by mouth daily.     [provider]  estrogens, conjugated, (PREMARIN) 0.625 MG tablet Take 0.625 mg by mouth daily. Take daily for 21 days then do not take for 7 days.    [provider]  fluticasone furoate-vilanterol (BREO ELLIPTA) 100-25 MCG/ACT AEPB Inhale 1 puff into the lungs daily.    [provider]   gabapentin (NEURONTIN) 800 MG tablet Take 800 mg by mouth 3 (three) times daily.     [provider]  ibuprofen (ADVIL,MOTRIN) 600 MG tablet Take 600 mg by mouth every 8 (eight) hours as needed.    [provider]  Multiple Vitamins-Minerals (EYE VITAMINS & MINERALS PO) Take by mouth.    [provider]  omeprazole (PRILOSEC) 20 MG capsule Take 20 mg by mouth daily.    [provider]  ondansetron (ZOFRAN) 4 MG tablet Take 1 tablet (4 mg total) by mouth every 6 (six) hours. 12/09/20   Wurst, [place], PA-C  rOPINIRole (REQUIP) 0.5 MG tablet Take 0.5 mg by mouth 3 (three) times daily.    [provider]  senna (SENOKOT) 8.6 MG TABS tablet Take 1 tablet by mouth.    [provider]  Spacer/Aero-Holding Chambers (AEROCHAMBER MV) inhaler Use as instructed 05/25/19   Salena Saner, MD  triamterene-hydrochlorothiazide (DYAZIDE) 37.5-25 MG capsule Take 1 capsule by mouth daily.    [provider]  vitamin C (ASCORBIC ACID) 250 MG tablet Take 250 mg by mouth daily.    [provider]  Vitamin D, Cholecalciferol, 400 UNITS CHEW Chew 400 Units by mouth daily.    [provider]    Family History Family History  Problem Relation Age of Onset   Breast cancer Mother 71   Colon cancer Father    Cancer Father        lung    Social History Social History   Tobacco Use   Smoking status: Never   Smokeless tobacco: Never  Substance Use Topics   Alcohol use: No   Drug use: No     Allergies   Prednisone   Review of Systems Review of Systems Per HPI  Physical Exam Triage Vital Signs ED Triage Vitals  Enc Vitals Group     BP 12/10/22 1008 103/63     Pulse Rate 12/10/22 1008 79     Resp 12/10/22 1008 18     Temp 12/10/22 1008 98 F (36.7 C)     Temp Source 12/10/22 1008 Oral     SpO2 12/10/22 1008 98 %     Weight --      Height --      Head Circumference --      Peak Flow --      Pain Score 12/10/22  1010 10     Pain Loc --      Pain Edu? --      Excl. in GC? --    No data found.  Updated Vital  Signs BP 103/63 (BP Location: Right Arm)   Pulse 79   Temp 98 F (36.7 C) (Oral)   Resp 18   SpO2 98%   Visual Acuity Right Eye Distance:   Left Eye Distance:   Bilateral Distance:    Right Eye Near:   Left Eye Near:    Bilateral Near:     Physical Exam Vitals and nursing note reviewed.  Constitutional:      General: She is not in acute distress.    Appearance: Normal appearance. She is not toxic-appearing.  HENT:     Mouth/Throat:     Mouth: Mucous membranes are moist.     Pharynx: Oropharynx is clear.  Pulmonary:     Effort: Pulmonary effort is normal. No respiratory distress.  Genitourinary:    Comments: Inspection: no swelling, bruising, obvious deformity or redness to left hip or leg Palpation: left hip nontender to palpation; no obvious deformities palpated ROM: Range of motion of left hip difficult to assess secondary to pain and instability; patient using cane Strength: 5/5 bilateral lower extremities Neurovascular: neurovascularly intact in bilateral lower extremities Skin:    General: Skin is warm and dry.     Capillary Refill: Capillary refill takes less than 2 seconds.     Coloration: Skin is not jaundiced or pale.     Findings: No erythema.  Neurological:     Mental Status: She is alert and oriented to person, place, and time.     Gait: Gait abnormal (using cane).  Psychiatric:        Behavior: Behavior is cooperative.      UC Treatments / Results  Labs (all labs ordered are listed, but only abnormal results are displayed) Labs Reviewed - No data to display  EKG   Radiology No results found.  Procedures Procedures (including critical care time)  Medications Ordered in UC Medications - No data to display  Initial Impression / Assessment and Plan / UC Course  I have reviewed the triage vital signs and the nursing notes.  Pertinent labs &  imaging results that were available during my care of the patient were reviewed by me and considered in my medical decision making (see chart for details).   Patient is well-appearing, normotensive, afebrile, not tachycardic, not tachypneic, oxygenating well on room air.    1. Osteoarthritis of left hip, unspecified osteoarthritis type No red flags or new injury, this is chronic problem for patient-has total hip replacement scheduled for next month Start diclofenac gel Recommended close follow-up with orthopedic provider for further evaluation/management  The patient was given the opportunity to ask questions.  All questions answered to their satisfaction.  The patient is in agreement to this plan.    Final Clinical Impressions(s) / UC Diagnoses   Final diagnoses:  Osteoarthritis of left hip, unspecified osteoarthritis type     Discharge Instructions      Start using the diclofenac gel to help with your hip pain; this is similar to the meloxicam but should not alter your kidney function.  Plan to follow-up with your orthopedic provider as soon as they are able.   ED Prescriptions     Medication Sig Dispense Auth. Provider   diclofenac Sodium (VOLTAREN) 1 % GEL Apply 2 g topically 4 (four) times daily. 50 g Valentino Nose, NP      PDMP not reviewed this encounter.   Valentino Nose, NP 12/10/22 [phone]

## 2022-12-10 NOTE — Discharge Instructions (Signed)
Start using the diclofenac gel to help with your hip pain; this is similar to the meloxicam but should not alter your kidney function.  Plan to follow-up with your orthopedic provider as soon as they are able.

## 2022-12-25 ENCOUNTER — Other Ambulatory Visit: Payer: Self-pay | Admitting: Family Medicine

## 2022-12-25 DIAGNOSIS — Z1231 Encounter for screening mammogram for malignant neoplasm of breast: Secondary | ICD-10-CM

## 2023-01-21 ENCOUNTER — Encounter
Admission: RE | Admit: 2023-01-21 | Discharge: 2023-01-21 | Disposition: A | Discharge status: Home / Self Care | Payer: Medicare HMO | Source: Ambulatory Visit | Attending: Orthopedic Surgery | Admitting: Orthopedic Surgery

## 2023-01-21 VITALS — BP 151/85 | HR 72 | Resp 12 | Ht 63.0 in | Wt 120.4 lb

## 2023-01-21 DIAGNOSIS — R7303 Prediabetes: Secondary | ICD-10-CM | POA: Insufficient documentation

## 2023-01-21 DIAGNOSIS — M1612 Unilateral primary osteoarthritis, left hip: Secondary | ICD-10-CM | POA: Insufficient documentation

## 2023-01-21 DIAGNOSIS — Z01812 Encounter for preprocedural laboratory examination: Secondary | ICD-10-CM

## 2023-01-21 DIAGNOSIS — Z01818 Encounter for other preprocedural examination: Secondary | ICD-10-CM | POA: Diagnosis not present

## 2023-01-21 DIAGNOSIS — Z01810 Encounter for preprocedural cardiovascular examination: Secondary | ICD-10-CM | POA: Diagnosis not present

## 2023-01-21 DIAGNOSIS — I10 Essential (primary) hypertension: Secondary | ICD-10-CM | POA: Diagnosis not present

## 2023-01-21 DIAGNOSIS — R7309 Other abnormal glucose: Secondary | ICD-10-CM

## 2023-01-21 HISTORY — DX: Plantar fascial fibromatosis: M72.2

## 2023-01-21 HISTORY — DX: Other complications of anesthesia, initial encounter: T88.59XA

## 2023-01-21 HISTORY — DX: Polyp of colon: K63.5

## 2023-01-21 HISTORY — DX: Personal history of other diseases of the digestive system: Z87.19

## 2023-01-21 HISTORY — DX: Iron deficiency anemia, unspecified: D50.9

## 2023-01-21 HISTORY — DX: Unspecified osteoarthritis, unspecified site: M19.90

## 2023-01-21 HISTORY — DX: Prediabetes: R73.03

## 2023-01-21 HISTORY — DX: Mixed incontinence: N39.46

## 2023-01-21 HISTORY — DX: Unspecified asthma, uncomplicated: J45.909

## 2023-01-21 LAB — COMPREHENSIVE METABOLIC PANEL
ALT: 26 U/L (ref 0–44)
AST: 24 U/L (ref 15–41)
Albumin: 4.1 g/dL (ref 3.5–5.0)
Alkaline Phosphatase: 101 U/L (ref 38–126)
Anion gap: 7 (ref 5–15)
BUN: 22 mg/dL (ref 8–23)
CO2: 29 mmol/L (ref 22–32)
Calcium: 9.6 mg/dL (ref 8.9–10.3)
Chloride: 102 mmol/L (ref 98–111)
Creatinine, Ser: 0.87 mg/dL (ref 0.44–1.00)
GFR, Estimated: >60 mL/min (ref >60)
Glucose, Bld: 88 mg/dL (ref 70–99)
Potassium: 3.2 mmol/L — ABNORMAL LOW (ref 3.5–5.1)
Sodium: 138 mmol/L (ref 135–145)
Total Bilirubin: 0.9 mg/dL (ref 0.3–1.2)
Total Protein: 7.3 g/dL (ref 6.5–8.1)

## 2023-01-21 LAB — SURGICAL PCR SCREEN
MRSA, PCR: NEGATIVE
Staphylococcus aureus: NEGATIVE

## 2023-01-21 LAB — C-REACTIVE PROTEIN: CRP: <0.5 mg/dL (ref <1.0)

## 2023-01-21 LAB — URINALYSIS, ROUTINE W REFLEX MICROSCOPIC
Bacteria, UA: NONE SEEN
Bilirubin Urine: NEGATIVE
Glucose, UA: NEGATIVE mg/dL
Hgb urine dipstick: NEGATIVE
Ketones, ur: NEGATIVE mg/dL
Nitrite: NEGATIVE
Protein, ur: NEGATIVE mg/dL
Specific Gravity, Urine: 1.017 (ref 1.005–1.030)
pH: 6 (ref 5.0–8.0)

## 2023-01-21 LAB — TYPE AND SCREEN
ABO/RH(D): A POS
Antibody Screen: NEGATIVE

## 2023-01-21 LAB — SEDIMENTATION RATE: Sed Rate: 14 mm/hr (ref 0–30)

## 2023-01-21 NOTE — Patient Instructions (Addendum)
Your procedure is scheduled on:01-29-23 Wednesday Report to the Registration Desk on the 1st floor of the [facility].Then proceed to the 2nd floor Surgery Desk To find out your arrival time, please call [phone] between 1PM - 3PM on:01-28-23 Tuesday If your arrival time is 6:00 am, do not arrive before that time as the [facility] entrance doors do not open until 6:00 am.  REMEMBER: Instructions that are not followed completely may result in serious medical risk, up to and including death; or upon the discretion of your surgeon and anesthesiologist your surgery may need to be rescheduled.  Do not eat food after midnight the night before surgery.  No gum chewing or hard candies.  You may however, drink CLEAR liquids up to 2 hours before you are scheduled to arrive for your surgery. Do not drink anything within 2 hours of your scheduled arrival time.  Clear liquids include: - water  - apple juice without pulp - gatorade (not RED colors) - black coffee or tea (Do NOT add milk or creamers to the coffee or tea) Do NOT drink anything that is not on this list  In addition, your doctor has ordered for you to drink the provided:  Ensure Pre-Surgery Clear Carbohydrate Drink  Drinking this carbohydrate drink up to two hours before surgery helps to reduce insulin resistance and improve patient outcomes. Please complete drinking 2 hours before scheduled arrival time.  One week prior to surgery: Stop Anti-inflammatories (NSAIDS) such as Advil, Aleve, Ibuprofen, Motrin, Naproxen, Naprosyn and Aspirin based products such as Excedrin, Goody's Powder, BC Powder.You may however, take Tylenol/Hydrocodone if needed for pain up until the day of surgery. Stop ANY OVER THE COUNTER supplements/vitamins NOW (01-21-23) until after surgery (Calcium,AREDS, Vitamin C and D)   Continue taking all prescribed medications with the exception of the following:   TAKE ONLY THESE MEDICATIONS THE MORNING OF SURGERY  WITH A SIP OF WATER: -atorvastatin (LIPITOR)  -citalopram (CELEXA)  -buPROPion (WELLBUTRIN XL)  -tpantoprazole (PROTONIX) -take one the night before and one on the morning of surgery - helps to prevent nausea after surgery.)  Use your Breo Ellipta the day of surgery  No Alcohol for 24 hours before or after surgery.  No Smoking including e-cigarettes for 24 hours before surgery.  No chewable tobacco products for at least 6 hours before surgery.  No nicotine patches on the day of surgery.  Do not use any "recreational" drugs for at least a week (preferably 2 weeks) before your surgery.  Please be advised that the combination of cocaine and anesthesia may have negative outcomes, up to and including death. If you test positive for cocaine, your surgery will be cancelled.  On the morning of surgery brush your teeth with toothpaste and water, you may rinse your mouth with mouthwash if you wish. Do not swallow any toothpaste or mouthwash.  Use CHG Soap as directed on instruction sheet.  Do not wear jewelry, make-up, hairpins, clips or nail polish.  Do not wear lotions, powders, or perfumes.   Do not shave body hair from the neck down 48 hours before surgery.  Contact lenses, hearing aids and dentures may not be worn into surgery.  Do not bring valuables to the hospital. [facility] is not responsible for any missing/lost belongings or valuables.   Notify your doctor if there is any change in your medical condition (cold, fever, infection).  Wear comfortable clothing (specific to your surgery type) to the hospital.  After surgery, you can help  prevent lung complications by doing breathing exercises.  Take deep breaths and cough every 1-2 hours. Your doctor may order a device called an Incentive Spirometer to help you take deep breaths. When coughing or sneezing, hold a pillow firmly against your incision with both hands. This is called "splinting." Doing this helps protect your  incision. It also decreases belly discomfort.  If you are being admitted to the hospital overnight, leave your suitcase in the car. After surgery it may be brought to your room.  In case of increased patient census, it may be necessary for you, the patient, to continue your postoperative care in the Same Day Surgery department.  If you are being discharged the day of surgery, you will not be allowed to drive home. You will need a responsible individual to drive you home and stay with you for 24 hours after surgery.   If you are taking public transportation, you will need to have a responsible individual with you.  Please call the Pre-admissions Testing Dept. at [phone] if you have any questions about these instructions.  Surgery Visitation Policy:  Patients having surgery or a procedure may have two visitors.  Children under the age of 70 must have an adult with them who is not the patient.  Inpatient Visitation:    Visiting hours are 7 a.m. to 8 p.m. Up to four visitors are allowed at one time in a patient room. The visitors may rotate out with other people during the day.  One visitor age 57 or older may stay with the patient overnight and must be in the room by 8 p.m.    Pre-operative 5 CHG Bath Instructions   You can play a key role in reducing the risk of infection after surgery. Your skin needs to be as free of germs as possible. You can reduce the number of germs on your skin by washing with CHG (chlorhexidine gluconate) soap before surgery. CHG is an antiseptic soap that kills germs and continues to kill germs even after washing.   DO NOT use if you have an allergy to chlorhexidine/CHG or antibacterial soaps. If your skin becomes reddened or irritated, stop using the CHG and notify one of our RNs at [phone].   Please shower with the CHG soap starting 4 days before surgery using the following schedule:     Please keep in mind the following:  DO NOT shave,  including legs and underarms, starting the day of your first shower.   You may shave your face at any point before/day of surgery.  Place clean sheets on your bed the day you start using CHG soap. Use a clean washcloth (not used since being washed) for each shower. DO NOT sleep with pets once you start using the CHG.   CHG Shower Instructions:  If you choose to wash your hair and private area, wash first with your normal shampoo/soap.  After you use shampoo/soap, rinse your hair and body thoroughly to remove shampoo/soap residue.  Turn the water OFF and apply about 3 tablespoons (45 ml) of CHG soap to a CLEAN washcloth.  Apply CHG soap ONLY FROM YOUR NECK DOWN TO YOUR TOES (washing for 3-5 minutes)  DO NOT use CHG soap on face, private areas, open wounds, or sores.  Pay special attention to the area where your surgery is being performed.  If you are having back surgery, having someone wash your back for you may be helpful. Wait 2 minutes after CHG soap is applied,  then you may rinse off the CHG soap.  Pat dry with a clean towel  Put on clean clothes/pajamas   If you choose to wear lotion, please use ONLY the CHG-compatible lotions on the back of this paper.     Additional instructions for the day of surgery: DO NOT APPLY any lotions, deodorants, cologne, or perfumes.   Put on clean/comfortable clothes.  Brush your teeth.  Ask your nurse before applying any prescription medications to the skin.      CHG Compatible Lotions   Aveeno Moisturizing lotion  Cetaphil Moisturizing Cream  Cetaphil Moisturizing Lotion  Clairol Herbal Essence Moisturizing Lotion, Dry Skin  Clairol Herbal Essence Moisturizing Lotion, Extra Dry Skin  Clairol Herbal Essence Moisturizing Lotion, Normal Skin  Curel Age Defying Therapeutic Moisturizing Lotion with Alpha Hydroxy  Curel Extreme Care Body Lotion  Curel Soothing Hands Moisturizing Hand Lotion  Curel Therapeutic Moisturizing Cream, Fragrance-Free   Curel Therapeutic Moisturizing Lotion, Fragrance-Free  Curel Therapeutic Moisturizing Lotion, Original Formula  Eucerin Daily Replenishing Lotion  Eucerin Dry Skin Therapy Plus Alpha Hydroxy Crme  Eucerin Dry Skin Therapy Plus Alpha Hydroxy Lotion  Eucerin Original Crme  Eucerin Original Lotion  Eucerin Plus Crme Eucerin Plus Lotion  Eucerin TriLipid Replenishing Lotion  Keri Anti-Bacterial Hand Lotion  Keri Deep Conditioning Original Lotion Dry Skin Formula Softly Scented  Keri Deep Conditioning Original Lotion, Fragrance Free Sensitive Skin Formula  Keri Lotion Fast Absorbing Fragrance Free Sensitive Skin Formula  Keri Lotion Fast Absorbing Softly Scented Dry Skin Formula  Keri Original Lotion  Keri Skin Renewal Lotion Keri Silky Smooth Lotion  Keri Silky Smooth Sensitive Skin Lotion  Nivea Body Creamy Conditioning Oil  Nivea Body Extra Enriched Lotion  Nivea Body Original Lotion  Nivea Body Sheer Moisturizing Lotion Nivea Crme  Nivea Skin Firming Lotion  NutraDerm 30 Skin Lotion  NutraDerm Skin Lotion  NutraDerm Therapeutic Skin Cream  NutraDerm Therapeutic Skin Lotion  ProShield Protective Hand Cream  Provon moisturizing lotion  How to Use an Incentive Spirometer An incentive spirometer is a tool that measures how well you are filling your lungs with each breath. Learning to take long, deep breaths using this tool can help you keep your lungs clear and active. This may help to reverse or lessen your chance of developing breathing (pulmonary) problems, especially infection. You may be asked to use a spirometer: After a surgery. If you have a lung problem or a history of smoking. After a long period of time when you have been unable to move or be active. If the spirometer includes an indicator to show the highest number that you have reached, your health care provider or respiratory therapist will help you set a goal. Keep a log of your progress as told by your health care  provider. What are the risks? Breathing too quickly may cause dizziness or cause you to pass out. Take your time so you do not get dizzy or light-headed. If you are in pain, you may need to take pain medicine before doing incentive spirometry. It is harder to take a deep breath if you are having pain. How to use your incentive spirometer  Sit up on the edge of your bed or on a chair. Hold the incentive spirometer so that it is in an upright position. Before you use the spirometer, breathe out normally. Place the mouthpiece in your mouth. Make sure your lips are closed tightly around it. Breathe in slowly and as deeply as you can through your mouth,  causing the piston or the ball to rise toward the top of the chamber. Hold your breath for 3-5 seconds, or for as long as possible. If the spirometer includes a coach indicator, use this to guide you in breathing. Slow down your breathing if the indicator goes above the marked areas. Remove the mouthpiece from your mouth and breathe out normally. The piston or ball will return to the bottom of the chamber. Rest for a few seconds, then repeat the steps 10 or more times. Take your time and take a few normal breaths between deep breaths so that you do not get dizzy or light-headed. Do this every 1-2 hours when you are awake. If the spirometer includes a goal marker to show the highest number you have reached (best effort), use this as a goal to work toward during each repetition. After each set of 10 deep breaths, cough a few times. This will help to make sure that your lungs are clear. If you have an incision on your chest or abdomen from surgery, place a pillow or a rolled-up towel firmly against the incision when you cough. This can help to reduce pain while taking deep breaths and coughing. General tips When you are able to get out of bed: Walk around often. Continue to take deep breaths and cough in order to clear your lungs. Keep using the  incentive spirometer until your health care provider says it is okay to stop using it. If you have been in the hospital, you may be told to keep using the spirometer at home. Contact a health care provider if: You are having difficulty using the spirometer. You have trouble using the spirometer as often as instructed. Your pain medicine is not giving enough relief for you to use the spirometer as told. You have a fever. Get help right away if: You develop shortness of breath. You develop a cough with bloody mucus from the lungs. You have fluid or blood coming from an incision site after you cough. Summary An incentive spirometer is a tool that can help you learn to take long, deep breaths to keep your lungs clear and active. You may be asked to use a spirometer after a surgery, if you have a lung problem or a history of smoking, or if you have been inactive for a long period of time. Use your incentive spirometer as instructed every 1-2 hours while you are awake. If you have an incision on your chest or abdomen, place a pillow or a rolled-up towel firmly against your incision when you cough. This will help to reduce pain. Get help right away if you have shortness of breath, you cough up bloody mucus, or blood comes from your incision when you cough. This information is not intended to replace advice given to you by your health care provider. Make sure you discuss any questions you have with your health care provider. Document Revised: 08/16/2019 Document Reviewed: 08/16/2019 Elsevier Patient Education  2024 [employer].

## 2023-01-22 NOTE — Progress Notes (Signed)
  [facility] Perioperative Services: Pre-Admission/Anesthesia Testing  Abnormal Lab Notification   Date: 01/22/23  Name: Vanessa Newman MRN:   [id number]  Re: Abnormal labs noted during PAT appointment   Notified:  Provider Name Provider Role Notification Mode  Francesco Sor, MD Orthopedics (Surgeon) Routed and/or faxed via [facility]   Clinical Information and Notes:  ABNORMAL LAB VALUE(S): Lab Results  Component Value Date   K 3.2 (L) 01/21/2023   Vanessa Newman is scheduled for an elective LEFT TOTAL HIP ARTHROPLASTY on 01/29/2023. In review of her medication reconciliation, it is noted that the patient is taking prescribed diuretic medications (Dyazide 37.5/25 mg) daily.    Please note, in efforts to promote a safe and effective anesthetic course, per current guidelines/standards set by the [facility] anesthesia team, the minimal acceptable K+ level for the patient to proceed with general anesthesia is 3.0 mmol/L. With that being said, if the patient drops any lower, her elective procedure will need to be postponed until K+ is better optimized. Abnormal result is being forwarded to primary attending surgeon for review and consideration of further optimization.   Order placed to have K+ rechecked on the day of her procedure to ensure correction of the noted derangement.    Quentin Mulling, MSN, APRN, FNP-C, CEN [facility]  Peri-operative Services Nurse Practitioner Phone: [phone] Fax: [phone] 01/22/23 8:26 AM

## 2023-01-24 NOTE — Discharge Instructions (Signed)
Instructions after Total Hip Replacement   James P. Hooten, Jr., M.D.    Dept. of Orthopaedics & Sports Medicine [facility] [address] [place], [place]  [address]  Phone: [phone]   Fax: [phone]        [email or web address]        DIET: Drink plenty of non-alcoholic fluids. Resume your normal diet. Include foods high in fiber.  ACTIVITY:  You may use crutches or a walker with weight-bearing as tolerated, unless instructed otherwise. You may be weaned off of the walker or crutches by your Physical Therapist.  Do NOT reach below the level of your knees or cross your legs until allowed.    Continue doing gentle exercises. Exercising will reduce the pain and swelling, increase motion, and prevent muscle weakness.   Please continue to use the TED compression stockings for 6 weeks. You may remove the stockings at night, but should reapply them in the morning. Do not drive or operate any equipment until instructed.  WOUND CARE:  Continue to use ice packs periodically to reduce pain and swelling. Keep the incision clean and dry. You may bathe or shower after the staples are removed at the first office visit following surgery.  MEDICATIONS: You may resume your regular medications. Please take the pain medication as prescribed on the medication. Do not take pain medication on an empty stomach. You have been given a prescription for a blood thinner to prevent blood clots. Please take the medication as instructed. (NOTE: After completing a 2 week course of Lovenox, take one Enteric-coated aspirin once a day.) Pain medications and iron supplements can cause constipation. Use a stool softener (Senokot or Colace) on a daily basis and a laxative (dulcolax or miralax) as needed. Do not drive or drink alcoholic beverages when taking pain medications.  CALL THE OFFICE FOR: Temperature above 101 degrees Excessive bleeding or drainage on the dressing. Excessive swelling,  coldness, or paleness of the toes. Persistent nausea and vomiting.  FOLLOW-UP:  You should have an appointment to return to the office in 6 weeks after surgery. Arrangements have been made for continuation of Physical Therapy (either home therapy or outpatient therapy).     [facility] Department Directory         [email or web address]       [email or web address]          Cardiology  Appointments: [place] - [phone] [place] - [phone]  Endocrinology  Appointments: [place] - [phone] [place] - [phone]  Gastroenterology  Appointments: [place] - [phone] [place] - [phone]        General Surgery   Appointments: [place] - [phone]  Internal Medicine/Family Medicine  Appointments: [place] - [phone] [place] - [phone] [place] - [phone]  Metabolic and Weigh Loss Surgery  Appointments: [place] - [phone]        Neurology  Appointments: [place] - [phone] [place] - [phone]  Neurosurgery  Appointments: [place] - [phone]  Obstetrics & Gynecology  Appointments: [place] - [phone] [place] - [phone]        Pediatrics  Appointments: [place] - [phone] [place] - [phone]  Physiatry  Appointments: [place] -[phone]  Physical Therapy  Appointments: [place] - [phone] [place] - [phone]        Podiatry  Appointments: [place] - [phone] [place] - [phone]  Pulmonology  Appointments: [place] - [phone]  Rheumatology  Appointments: [place] - [phone]        [place] Location: [facility]  [address] [place], [place]  [address]  [place] Location: [facility] [address] [place], [place]  [address]  [place] Location: [facility] [address] [place], [place]  [address]    

## 2023-01-26 NOTE — H&P (Signed)
ORTHOPAEDIC HISTORY & PHYSICAL Latanya Maudlin, PA - 01/22/2023 11:00 AM EDT Formatting of this note is different from the original. Images from the original note were not included. Chief Complaint Chief Complaint Patient presents with Hip Pain H & P LEFT HIP  Reason for Visit Vanessa Newman is a 82 y.o. who presents today for a history and physical. Patient is to undergo a left total hip arthroplasty on 01/29/2023. Since her last visit here to clinic there have been no improvement in her condition. The patient expresses her desire to proceed with surgery.  She had the onset of left hip and groin pain in January without any apparent trauma or aggravating event. The pain is worse with weight bearing and range of motion . She reports a catching and locking sensation. She also feels as though the left lower extremity is "shorter" than the right. The hip pain limits the patient's ability to ambulate long distances. The patient has not appreciated any significant improvement despite Tylenol, NSAIDs, gabapentin, narcotic analgesia, and intra-articular corticosteroid injection. She is using a rolling walker for ambulation. The patient states that the hip pain has progressed to the point that it is significantly interfering with her activities of daily living.  Patient is status post right total hip arthroplasty performed by Dr. Cleophas Dunker. Postop she was noted to have a fractured femur. Also had hypotension and been in the ICU for 3 days.  Past Medical History Past Medical History: Diagnosis Date Asthma, unspecified asthma severity, unspecified whether complicated, unspecified whether persistent (HHS-HCC) Depression 09/09/2013 Fecal smearing 01/26/2019 GERD (gastroesophageal reflux disease) 09/09/2013 History of chest pain With shortness of breath with/without exertion. EKG looks non-diagnostic at baseline. 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 KNEE ARTHROSCOPY Right 05/10/2009 Right knee arthroscopy partial medial lateral meniscectomy and lateral chondroplasty. REPLACEMENT TOTAL HIP W/ RESURFACING IMPLANTS Right 04/03/2010 Dr Lisette Abu [place] COLONOSCOPY 07/27/2021 Tubular adenomas/PHx CP/No repeat due to age/CTL EGD 07/27/2021 Normal EGD/No repeat/CTL LAPAROSCOPIC REPAIR PARAESOPHAGEAL HIATAL HERNIA N/A 11/30/2021 Procedure: LAPAROSCOPY, SURGICAL; REPAIR PARAESOPHAGEAL HIATAL HERNIA upper endoscopy; Surgeon: Wynema Birch, MD; Location: [facility] OR; Service: General Surgery; Laterality: N/A; ESSURE TUBAL LIGATION femur surgery Right Right femur ORIF 04/27/10 gallbladder surgery POSTERIOR LUMBAR SPINE FUSION ONE LEVEL 11/13/2009  Past Family History Family History Problem Relation Age of Onset Breast cancer Mother Colon cancer Father Lung cancer Father No Known Problems Sister No Known Problems Brother  Medications Current Outpatient Medications Medication Sig Dispense Refill ascorbic acid, vitamin C, (VITAMIN C) 250 MG tablet Take 250 mg by mouth once daily atorvastatin (LIPITOR) 40 MG tablet Take 40 mg by mouth once daily BREO ELLIPTA 100-25 mcg/dose DsDv inhaler Inhale 1 inhalation into the lungs once daily buPROPion (WELLBUTRIN XL) 150 MG XL tablet Take 150 mg by mouth once daily FOR MOOD calcium carbonate-vitamin D3 (CALTRATE 600+D) 600  mg(1,500mg ) -200 unit tablet Take 2 tablets by mouth once daily. citalopram (CELEXA) 20 MG tablet Take 20 mg by mouth once daily FOR MOOD conjugated estrogens (PREMARIN) 0.625 mg/gram vaginal cream Place 0.5 g vaginally once a week gabapentin (NEURONTIN) 800  MG tablet Take 800 mg by mouth 3 (three) times daily FOR PAIN HYDROcodone-acetaminophen (NORCO) 5-325 mg tablet Take 1 tablet by mouth at bedtime as needed for Pain ibuprofen (ADVIL) 200 MG tablet Take 200 mg by mouth once as needed for Pain MULTIVITAMIN ORAL Take 1 capsule by mouth once daily. pantoprazole (PROTONIX) 40 MG DR tablet Take 40 mg by mouth every morning sennosides (SENOKOT) 8.6 mg tablet Take 1 tablet by mouth as needed triamterene-hydrochlorothiazide (DYAZIDE) 37.5-25 mg capsule Take 1 capsule by mouth once daily vit A/vit C/vit E/zinc/copper (PRESERVISION AREDS ORAL) Take 1 tablet by mouth every morning  No current facility-administered medications for this visit.  Allergies Allergies Allergen Reactions Naprosyn [Naproxen] Shortness Of Breath Prednisone Anxiety Pt reports it makes her have suicidal thoughts   Review of Systems A comprehensive 14 point ROS was performed, reviewed, and the pertinent orthopaedic findings are documented in the HPI.  Exam BP 130/70 (BP Location: Left upper arm, Patient Position: Sitting, BP Cuff Size: Adult)  Ht 160 cm (5\' 3" )  Wt 54.7 kg (120 lb 9.6 oz)  BMI 21.36 kg/m  General: Well-developed well-nourished female seen in no acute distress. Is noted to have difficulty with ambulation using a rolling walker. Walks on her tiptoes of left leg  HEENT: Atraumatic,normocephalic. Pupils are equal and reactive to light. Oropharynx is clear with moist mucosa  Lungs: Clear to auscultation bilaterally  Cardiovascular: Regular rate and rhythm. Normal S1, S2. No murmurs. No appreciable gallops or rubs. Peripheral pulses are palpable.  Abdomen: Soft, non-tender, nondistended. Bowel  sounds present  Extremity: Left Hip: Pelvic tilt: Positive Limb lengths: The left lower extremity is 3/8 of an inch shorter than the right as per standing blocks. Soft tissue swelling:Negative Erythema: Negative Crepitance: Positive Tenderness: Greater trochanter is nontender to palpation. Moderate pain is elicited by axial compression or extremes of rotation. Atrophy: No atrophy. Fair to good hip flexor and abductor strength. Range of Motion:EXT/FLEX: 0/0/100 ADD/ABD: 20/0/20 IR/ER: 10/0/20  Neurological:  The patient is alert and oriented Sensation to light touch appears to be intact and within normal limits Gross motor strength appeared to be equal to 5/5  Vascular :  Peripheral pulses felt to be palpable. Capillary refill appears to be intact and within normal limits  X-ray  1. X-rays of the left hip taken on 10/15/2022 showed significant narrowing of the cartilage space with bone-on-bone articulation. Subchondral sclerosis and subchondral cyst formation are noted. Osteophyte formation is present.  Impression  1. Degenerative arthrosis left hip  Plan  1. Patient's medication was gone over on today's visit 2. Past medical history reviewed 3. Postop rehab course discussed 4. Follow-up 6 weeks postop. Sooner if any problems  This note was generated in part with voice recognition software and I apologize for any typographical errors that were not detected and corrected   Tera Partridge PA Electronically signed by Latanya Maudlin, PA at 01/22/2023 11:17 AM EDT

## 2023-01-29 ENCOUNTER — Observation Stay: Payer: Medicare HMO

## 2023-01-29 ENCOUNTER — Other Ambulatory Visit: Payer: Self-pay

## 2023-01-29 ENCOUNTER — Encounter: Payer: Self-pay | Admitting: Orthopedic Surgery

## 2023-01-29 ENCOUNTER — Ambulatory Visit: Payer: Medicare HMO | Admitting: Urgent Care

## 2023-01-29 ENCOUNTER — Encounter
Admission: RE | Disposition: A | Discharge status: Home / Self Care | Payer: Self-pay | Source: Home / Self Care | Attending: Orthopedic Surgery

## 2023-01-29 ENCOUNTER — Observation Stay
Admission: RE | Admit: 2023-01-29 | Discharge: 2023-01-30 | Disposition: A | Discharge status: Home / Self Care | Payer: Medicare HMO | Attending: Orthopedic Surgery | Admitting: Orthopedic Surgery

## 2023-01-29 DIAGNOSIS — Z79899 Other long term (current) drug therapy: Secondary | ICD-10-CM | POA: Insufficient documentation

## 2023-01-29 DIAGNOSIS — Z96641 Presence of right artificial hip joint: Secondary | ICD-10-CM | POA: Insufficient documentation

## 2023-01-29 DIAGNOSIS — Z96651 Presence of right artificial knee joint: Secondary | ICD-10-CM | POA: Insufficient documentation

## 2023-01-29 DIAGNOSIS — I10 Essential (primary) hypertension: Secondary | ICD-10-CM | POA: Diagnosis not present

## 2023-01-29 DIAGNOSIS — J45909 Unspecified asthma, uncomplicated: Secondary | ICD-10-CM | POA: Insufficient documentation

## 2023-01-29 DIAGNOSIS — M1612 Unilateral primary osteoarthritis, left hip: Principal | ICD-10-CM | POA: Insufficient documentation

## 2023-01-29 DIAGNOSIS — Z01812 Encounter for preprocedural laboratory examination: Secondary | ICD-10-CM

## 2023-01-29 DIAGNOSIS — Z85828 Personal history of other malignant neoplasm of skin: Secondary | ICD-10-CM | POA: Insufficient documentation

## 2023-01-29 DIAGNOSIS — Z87898 Personal history of other specified conditions: Secondary | ICD-10-CM

## 2023-01-29 DIAGNOSIS — E876 Hypokalemia: Secondary | ICD-10-CM

## 2023-01-29 DIAGNOSIS — M659 Synovitis and tenosynovitis, unspecified: Secondary | ICD-10-CM

## 2023-01-29 DIAGNOSIS — T502X5A Adverse effect of carbonic-anhydrase inhibitors, benzothiadiazides and other diuretics, initial encounter: Secondary | ICD-10-CM

## 2023-01-29 DIAGNOSIS — Z96642 Presence of left artificial hip joint: Secondary | ICD-10-CM

## 2023-01-29 HISTORY — PX: TOTAL HIP ARTHROPLASTY: SHX124

## 2023-01-29 LAB — POCT I-STAT, CHEM 8
BUN: 18 mg/dL (ref 8–23)
Calcium, Ion: 1.2 mmol/L (ref 1.15–1.40)
Chloride: 105 mmol/L (ref 98–111)
Creatinine, Ser: 0.9 mg/dL (ref 0.44–1.00)
Glucose, Bld: 98 mg/dL (ref 70–99)
HCT: 33 % — ABNORMAL LOW (ref 36.0–46.0)
Hemoglobin: 11.2 g/dL — ABNORMAL LOW (ref 12.0–15.0)
Potassium: 3.6 mmol/L (ref 3.5–5.1)
Sodium: 141 mmol/L (ref 135–145)
TCO2: 27 mmol/L (ref 22–32)

## 2023-01-29 SURGERY — ARTHROPLASTY, HIP, TOTAL,POSTERIOR APPROACH
Anesthesia: Spinal | Site: Hip | Laterality: Left

## 2023-01-29 MED ORDER — TRIAMTERENE-HCTZ 37.5-25 MG PO TABS: 1.0000 | ORAL_TABLET | ORAL | Status: DC

## 2023-01-29 MED ORDER — CHLORHEXIDINE GLUCONATE 0.12 % MT SOLN
OROMUCOSAL | Status: AC
  Filled 2023-01-29: qty 15

## 2023-01-29 MED ORDER — CEFAZOLIN SODIUM-DEXTROSE 2-4 GM/100ML-% IV SOLN
INTRAVENOUS | Status: AC
  Filled 2023-01-29: qty 100

## 2023-01-29 MED ORDER — ACETAMINOPHEN 10 MG/ML IV SOLN
INTRAVENOUS | Status: AC
  Filled 2023-01-29: qty 100

## 2023-01-29 MED ORDER — ALUM & MAG HYDROXIDE-SIMETH 200-200-20 MG/5ML PO SUSP: 30.0000 mL | ORAL | Status: DC | PRN

## 2023-01-29 MED ORDER — LACTATED RINGERS IV SOLN: INTRAVENOUS | Status: DC

## 2023-01-29 MED ORDER — CEFAZOLIN SODIUM-DEXTROSE 2-4 GM/100ML-% IV SOLN
2.0000 g | INTRAVENOUS | Status: AC
  Administered 2023-01-29: 2 g via INTRAVENOUS

## 2023-01-29 MED ORDER — TRANEXAMIC ACID-NACL 1000-0.7 MG/100ML-% IV SOLN: 1000.0000 mg | INTRAVENOUS | Status: DC

## 2023-01-29 MED ORDER — SENNOSIDES-DOCUSATE SODIUM 8.6-50 MG PO TABS
ORAL_TABLET | ORAL | Status: AC
  Filled 2023-01-29: qty 1

## 2023-01-29 MED ORDER — ASPIRIN 81 MG PO CHEW
CHEWABLE_TABLET | ORAL | Status: AC
  Filled 2023-01-29: qty 1

## 2023-01-29 MED ORDER — LIDOCAINE HCL URETHRAL/MUCOSAL 2 % EX GEL
CUTANEOUS | Status: AC
  Filled 2023-01-29: qty 6

## 2023-01-29 MED ORDER — OXYCODONE HCL 5 MG PO TABS: 5.0000 mg | ORAL_TABLET | Freq: Once | ORAL | Status: DC | PRN

## 2023-01-29 MED ORDER — GABAPENTIN 400 MG PO CAPS
800.0000 mg | ORAL_CAPSULE | Freq: Three times a day (TID) | ORAL | Status: DC
  Administered 2023-01-29 – 2023-01-30 (×2): 800 mg via ORAL
  Filled 2023-01-29 (×3): qty 2

## 2023-01-29 MED ORDER — OXYCODONE HCL 5 MG PO TABS: 10.0000 mg | ORAL_TABLET | ORAL | Status: DC | PRN

## 2023-01-29 MED ORDER — CELECOXIB 200 MG PO CAPS
ORAL_CAPSULE | ORAL | Status: AC
  Filled 2023-01-29: qty 2

## 2023-01-29 MED ORDER — ENSURE PRE-SURGERY PO LIQD
296.0000 mL | Freq: Once | ORAL | Status: AC
  Administered 2023-01-29: 296 mL via ORAL
  Filled 2023-01-29: qty 296

## 2023-01-29 MED ORDER — ACETAMINOPHEN 10 MG/ML IV SOLN
INTRAVENOUS | Status: DC | PRN
  Administered 2023-01-29: 1000 mg via INTRAVENOUS

## 2023-01-29 MED ORDER — GABAPENTIN 300 MG PO CAPS
300.0000 mg | ORAL_CAPSULE | Freq: Once | ORAL | Status: AC
  Administered 2023-01-29: 300 mg via ORAL

## 2023-01-29 MED ORDER — 0.9 % SODIUM CHLORIDE (POUR BTL) OPTIME
TOPICAL | Status: DC | PRN
  Administered 2023-01-29: 500 mL

## 2023-01-29 MED ORDER — ORAL CARE MOUTH RINSE: 15.0000 mL | Freq: Once | OROMUCOSAL | Status: AC

## 2023-01-29 MED ORDER — CELECOXIB 200 MG PO CAPS
200.0000 mg | ORAL_CAPSULE | Freq: Two times a day (BID) | ORAL | Status: DC
  Administered 2023-01-29 – 2023-01-30 (×2): 200 mg via ORAL

## 2023-01-29 MED ORDER — DEXAMETHASONE SODIUM PHOSPHATE 10 MG/ML IJ SOLN
INTRAMUSCULAR | Status: AC
  Filled 2023-01-29: qty 1

## 2023-01-29 MED ORDER — TRANEXAMIC ACID-NACL 1000-0.7 MG/100ML-% IV SOLN
INTRAVENOUS | Status: AC
  Filled 2023-01-29: qty 100

## 2023-01-29 MED ORDER — CHLORHEXIDINE GLUCONATE 0.12 % MT SOLN
15.0000 mL | Freq: Once | OROMUCOSAL | Status: AC
  Administered 2023-01-29: 15 mL via OROMUCOSAL

## 2023-01-29 MED ORDER — ACETAMINOPHEN 10 MG/ML IV SOLN
1000.0000 mg | Freq: Four times a day (QID) | INTRAVENOUS | Status: AC
  Administered 2023-01-29 – 2023-01-30 (×4): 1000 mg via INTRAVENOUS

## 2023-01-29 MED ORDER — ASPIRIN 81 MG PO CHEW
81.0000 mg | CHEWABLE_TABLET | Freq: Two times a day (BID) | ORAL | Status: DC
  Administered 2023-01-29 – 2023-01-30 (×2): 81 mg via ORAL

## 2023-01-29 MED ORDER — CHLORHEXIDINE GLUCONATE 4 % EX SOLN
60.0000 mL | Freq: Once | CUTANEOUS | Status: AC
  Administered 2023-01-29: 4 via TOPICAL

## 2023-01-29 MED ORDER — OXYCODONE HCL 5 MG/5ML PO SOLN: 5.0000 mg | Freq: Once | ORAL | Status: DC | PRN

## 2023-01-29 MED ORDER — PROPOFOL 1000 MG/100ML IV EMUL
INTRAVENOUS | Status: AC
  Filled 2023-01-29: qty 100

## 2023-01-29 MED ORDER — PROPOFOL 10 MG/ML IV BOLUS
INTRAVENOUS | Status: AC
  Filled 2023-01-29: qty 20

## 2023-01-29 MED ORDER — METOCLOPRAMIDE HCL 10 MG PO TABS
10.0000 mg | ORAL_TABLET | Freq: Three times a day (TID) | ORAL | Status: DC
  Administered 2023-01-29 – 2023-01-30 (×3): 10 mg via ORAL

## 2023-01-29 MED ORDER — ONDANSETRON HCL 4 MG PO TABS: 4.0000 mg | ORAL_TABLET | Freq: Four times a day (QID) | ORAL | Status: DC | PRN

## 2023-01-29 MED ORDER — SODIUM CHLORIDE 0.9 % IR SOLN
Status: DC | PRN
  Administered 2023-01-29: 3000 mL

## 2023-01-29 MED ORDER — CELECOXIB 200 MG PO CAPS
400.0000 mg | ORAL_CAPSULE | Freq: Once | ORAL | Status: AC
  Administered 2023-01-29: 400 mg via ORAL

## 2023-01-29 MED ORDER — MENTHOL 3 MG MT LOZG: 1.0000 | LOZENGE | OROMUCOSAL | Status: DC | PRN

## 2023-01-29 MED ORDER — METOCLOPRAMIDE HCL 10 MG PO TABS
ORAL_TABLET | ORAL | Status: AC
  Filled 2023-01-29: qty 1

## 2023-01-29 MED ORDER — TRANEXAMIC ACID-NACL 1000-0.7 MG/100ML-% IV SOLN
INTRAVENOUS | Status: DC | PRN
  Administered 2023-01-29: 1000 mg via INTRAVENOUS

## 2023-01-29 MED ORDER — PHENOL 1.4 % MT LIQD: 1.0000 | OROMUCOSAL | Status: DC | PRN

## 2023-01-29 MED ORDER — FERROUS SULFATE 325 (65 FE) MG PO TABS
ORAL_TABLET | ORAL | Status: AC
  Filled 2023-01-29: qty 1

## 2023-01-29 MED ORDER — DEXAMETHASONE SODIUM PHOSPHATE 10 MG/ML IJ SOLN
8.0000 mg | Freq: Once | INTRAMUSCULAR | Status: AC
  Administered 2023-01-29: 8 mg via INTRAVENOUS

## 2023-01-29 MED ORDER — BISACODYL 10 MG RE SUPP: 10.0000 mg | Freq: Every day | RECTAL | Status: DC | PRN

## 2023-01-29 MED ORDER — PANTOPRAZOLE SODIUM 40 MG PO TBEC
DELAYED_RELEASE_TABLET | ORAL | Status: AC
  Filled 2023-01-29: qty 1

## 2023-01-29 MED ORDER — CITALOPRAM HYDROBROMIDE 20 MG PO TABS
20.0000 mg | ORAL_TABLET | ORAL | Status: DC
  Administered 2023-01-30: 20 mg via ORAL
  Filled 2023-01-29: qty 1

## 2023-01-29 MED ORDER — OXYCODONE HCL 5 MG PO TABS: 5.0000 mg | ORAL_TABLET | ORAL | Status: DC | PRN

## 2023-01-29 MED ORDER — PANTOPRAZOLE SODIUM 40 MG PO TBEC
40.0000 mg | DELAYED_RELEASE_TABLET | Freq: Two times a day (BID) | ORAL | Status: DC
  Administered 2023-01-29 – 2023-01-30 (×2): 40 mg via ORAL

## 2023-01-29 MED ORDER — SODIUM CHLORIDE 0.9 % IV SOLN: INTRAVENOUS | Status: DC

## 2023-01-29 MED ORDER — FLUTICASONE FUROATE-VILANTEROL 100-25 MCG/ACT IN AEPB
1.0000 | INHALATION_SPRAY | RESPIRATORY_TRACT | Status: DC
  Administered 2023-01-30: 1 via RESPIRATORY_TRACT
  Filled 2023-01-29: qty 28

## 2023-01-29 MED ORDER — PROPOFOL 500 MG/50ML IV EMUL
INTRAVENOUS | Status: DC | PRN
  Administered 2023-01-29: 50 ug/kg/min via INTRAVENOUS

## 2023-01-29 MED ORDER — HYDROMORPHONE HCL 1 MG/ML IJ SOLN: 0.5000 mg | INTRAMUSCULAR | Status: DC | PRN

## 2023-01-29 MED ORDER — BUPIVACAINE HCL (PF) 0.5 % IJ SOLN
INTRAMUSCULAR | Status: DC | PRN
  Administered 2023-01-29: 2.4 mL

## 2023-01-29 MED ORDER — BUPROPION HCL ER (XL) 150 MG PO TB24
150.0000 mg | ORAL_TABLET | ORAL | Status: DC
  Administered 2023-01-30: 150 mg via ORAL
  Filled 2023-01-29: qty 1

## 2023-01-29 MED ORDER — ACETAMINOPHEN 325 MG PO TABS: 325.0000 mg | ORAL_TABLET | Freq: Four times a day (QID) | ORAL | Status: DC | PRN

## 2023-01-29 MED ORDER — DIPHENHYDRAMINE HCL 12.5 MG/5ML PO ELIX: 12.5000 mg | ORAL_SOLUTION | ORAL | Status: DC | PRN

## 2023-01-29 MED ORDER — ATORVASTATIN CALCIUM 20 MG PO TABS
40.0000 mg | ORAL_TABLET | ORAL | Status: DC
  Administered 2023-01-30: 40 mg via ORAL

## 2023-01-29 MED ORDER — ONDANSETRON HCL 4 MG/2ML IJ SOLN
INTRAMUSCULAR | Status: DC | PRN
Start: 2023-01-29 — End: 2023-01-29
  Administered 2023-01-29: 4 mg via INTRAVENOUS

## 2023-01-29 MED ORDER — TRANEXAMIC ACID-NACL 1000-0.7 MG/100ML-% IV SOLN
1000.0000 mg | Freq: Once | INTRAVENOUS | Status: AC
  Administered 2023-01-29: 1000 mg via INTRAVENOUS

## 2023-01-29 MED ORDER — FENTANYL CITRATE (PF) 100 MCG/2ML IJ SOLN: 25.0000 ug | INTRAMUSCULAR | Status: DC | PRN

## 2023-01-29 MED ORDER — CELECOXIB 200 MG PO CAPS
ORAL_CAPSULE | ORAL | Status: AC
  Filled 2023-01-29: qty 1

## 2023-01-29 MED ORDER — MAGNESIUM HYDROXIDE 400 MG/5ML PO SUSP
30.0000 mL | Freq: Every day | ORAL | Status: DC
  Administered 2023-01-30: 30 mL via ORAL

## 2023-01-29 MED ORDER — PHENYLEPHRINE HCL-NACL 20-0.9 MG/250ML-% IV SOLN
INTRAVENOUS | Status: DC | PRN
  Administered 2023-01-29: 40 ug/min via INTRAVENOUS

## 2023-01-29 MED ORDER — CEFAZOLIN SODIUM-DEXTROSE 2-4 GM/100ML-% IV SOLN
2.0000 g | Freq: Four times a day (QID) | INTRAVENOUS | Status: AC
  Administered 2023-01-29 (×2): 2 g via INTRAVENOUS

## 2023-01-29 MED ORDER — FERROUS SULFATE 325 (65 FE) MG PO TABS
325.0000 mg | ORAL_TABLET | Freq: Two times a day (BID) | ORAL | Status: DC
  Administered 2023-01-29 – 2023-01-30 (×2): 325 mg via ORAL

## 2023-01-29 MED ORDER — PHENYLEPHRINE HCL-NACL 20-0.9 MG/250ML-% IV SOLN
INTRAVENOUS | Status: AC
  Filled 2023-01-29: qty 250

## 2023-01-29 MED ORDER — SURGIPHOR WOUND IRRIGATION SYSTEM - OPTIME: TOPICAL | Status: DC | PRN

## 2023-01-29 MED ORDER — GABAPENTIN 300 MG PO CAPS
ORAL_CAPSULE | ORAL | Status: AC
  Filled 2023-01-29: qty 1

## 2023-01-29 MED ORDER — FLEET ENEMA RE ENEM: 1.0000 | ENEMA | Freq: Once | RECTAL | Status: DC | PRN

## 2023-01-29 MED ORDER — TRAMADOL HCL 50 MG PO TABS: 50.0000 mg | ORAL_TABLET | ORAL | Status: DC | PRN

## 2023-01-29 MED ORDER — SENNOSIDES-DOCUSATE SODIUM 8.6-50 MG PO TABS
1.0000 | ORAL_TABLET | Freq: Two times a day (BID) | ORAL | Status: DC
  Administered 2023-01-29 – 2023-01-30 (×2): 1 via ORAL

## 2023-01-29 MED ORDER — PROPOFOL 1000 MG/100ML IV EMUL
INTRAVENOUS | Status: AC
  Filled 2023-01-29: qty 200

## 2023-01-29 MED ORDER — ONDANSETRON HCL 4 MG/2ML IJ SOLN: 4.0000 mg | Freq: Four times a day (QID) | INTRAMUSCULAR | Status: DC | PRN

## 2023-01-29 SURGICAL SUPPLY — 56 items
ACETAB CUP W/GRIPTION 54 (Plate) ×1 IMPLANT
BLADE CLIPPER SURG (BLADE) IMPLANT
BLADE SAW 90X25X1.19 OSCILLAT (BLADE) ×2 IMPLANT
BRUSH SCRUB EZ PLAIN DRY (MISCELLANEOUS) ×2 IMPLANT
CUP ACETAB W/GRIPTION 54 (Plate) IMPLANT
DRAPE INCISE IOBAN 66X60 STRL (DRAPES) ×2 IMPLANT
DRAPE SHEET LG 3/4 BI-LAMINATE (DRAPES) ×2 IMPLANT
DRSG AQUACEL AG ADV 3.5X14 (GAUZE/BANDAGES/DRESSINGS) ×2 IMPLANT
DRSG MEPILEX SACRM 8.7X9.8 (GAUZE/BANDAGES/DRESSINGS) ×2 IMPLANT
DRSG NON-ADHERENT DERMACEA 3X4 (GAUZE/BANDAGES/DRESSINGS) ×2 IMPLANT
DRSG TEGADERM 4X4.75 (GAUZE/BANDAGES/DRESSINGS) ×2 IMPLANT
DURAPREP 26ML APPLICATOR (WOUND CARE) ×4 IMPLANT
ELECT CAUTERY BLADE 6.4 (BLADE) ×2 IMPLANT
ELECT REM PT RETURN 9FT ADLT (ELECTROSURGICAL) ×1
ELECTRODE REM PT RTRN 9FT ADLT (ELECTROSURGICAL) ×2 IMPLANT
GLOVE BIOGEL M STRL SZ7.5 (GLOVE) ×8 IMPLANT
GLOVE SRG 8 PF TXTR STRL LF DI (GLOVE) ×4 IMPLANT
GLOVE SURG UNDER POLY LF SZ8 (GLOVE) ×2
GOWN STRL REUS W/ TWL LRG LVL3 (GOWN DISPOSABLE) ×4 IMPLANT
GOWN STRL REUS W/ TWL XL LVL3 (GOWN DISPOSABLE) ×2 IMPLANT
GOWN STRL REUS W/TWL LRG LVL3 (GOWN DISPOSABLE) ×2
GOWN STRL REUS W/TWL XL LVL3 (GOWN DISPOSABLE) ×1
GOWN TOGA ZIPPER T7+ PEEL AWAY (MISCELLANEOUS) ×2 IMPLANT
HANDLE YANKAUER SUCT OPEN TIP (MISCELLANEOUS) ×2 IMPLANT
HEAD M SROM 36MM PLUS 1.5 (Hips) IMPLANT
HEMOVAC 400CC 10FR (MISCELLANEOUS) ×2 IMPLANT
HOLDER FOLEY CATH W/STRAP (MISCELLANEOUS) ×2 IMPLANT
HOOD PEEL AWAY T7 (MISCELLANEOUS) ×2 IMPLANT
IV NS IRRIG 3000ML ARTHROMATIC (IV SOLUTION) ×2 IMPLANT
KIT PEG BOARD PINK (KITS) ×2 IMPLANT
KIT TURNOVER KIT A (KITS) ×2 IMPLANT
LINER NEUTRAL 36ID 54OD (Liner) IMPLANT
MANIFOLD NEPTUNE II (INSTRUMENTS) ×4 IMPLANT
NS IRRIG 500ML POUR BTL (IV SOLUTION) ×2 IMPLANT
PACK HIP PROSTHESIS (MISCELLANEOUS) ×2 IMPLANT
PIN STEIN THRED 5/32 (Pin) IMPLANT
PULSAVAC PLUS IRRIG FAN TIP (DISPOSABLE) ×1
SOL PREP PVP 2OZ (MISCELLANEOUS) ×1
SOLUTION IRRIG SURGIPHOR (IV SOLUTION) ×2 IMPLANT
SOLUTION PREP PVP 2OZ (MISCELLANEOUS) ×2 IMPLANT
SPONGE DRAIN TRACH 4X4 STRL 2S (GAUZE/BANDAGES/DRESSINGS) ×2 IMPLANT
SROM M HEAD 36MM PLUS 1.5 (Hips) ×1 IMPLANT
STAPLER SKIN PROX 35W (STAPLE) ×2 IMPLANT
STEM FEM ACTIS STD SZ4 (Stem) IMPLANT
SUT ETHIBOND #5 BRAIDED 30INL (SUTURE) ×2 IMPLANT
SUT VIC AB 0 CT1 36 (SUTURE) ×4 IMPLANT
SUT VIC AB 1 CT1 36 (SUTURE) ×4 IMPLANT
SUT VIC AB 2-0 CT1 27 (SUTURE) ×1
SUT VIC AB 2-0 CT1 TAPERPNT 27 (SUTURE) ×2 IMPLANT
TAPE CLOTH 3X10 WHT NS LF (GAUZE/BANDAGES/DRESSINGS) ×2 IMPLANT
TIP FAN IRRIG PULSAVAC PLUS (DISPOSABLE) ×2 IMPLANT
TOWEL OR 17X26 4PK STRL BLUE (TOWEL DISPOSABLE) IMPLANT
TRAP FLUID SMOKE EVACUATOR (MISCELLANEOUS) ×2 IMPLANT
TRAY FOLEY MTR SLVR 16FR STAT (SET/KITS/TRAYS/PACK) ×2 IMPLANT
TUBING CONNECTING 10 (TUBING) ×4 IMPLANT
WATER STERILE IRR 1000ML POUR (IV SOLUTION) ×2 IMPLANT

## 2023-01-29 NOTE — Op Note (Signed)
OPERATIVE NOTE  DATE OF SURGERY:  01/29/2023  PATIENT NAME:  Vanessa Newman   DOB: 1941-04-17  MRN: [id number]  PRE-OPERATIVE DIAGNOSIS: Degenerative arthrosis of the left hip, primary  POST-OPERATIVE DIAGNOSIS:  Same  PROCEDURE:  Left total hip arthroplasty  SURGEON:  Jena Gauss. M.D.  ASSISTANT:  Gean Birchwood, PA-C (present and scrubbed throughout the case, critical for assistance with exposure, retraction, instrumentation, and closure)  ANESTHESIA: spinal  ESTIMATED BLOOD LOSS: 100 mL  FLUIDS REPLACED: 1000 mL of crystalloid  DRAINS: 2 medium Hemovac drains  IMPLANTS UTILIZED: DePuy size 4 standard offset Actis femoral stem, 54 mm OD Pinnacle GRIPTION Sector acetabular component, neutral Pinnacle Altrx polyethylene insert, and a 36 mm M-SPEC +1.5 mm hip ball  INDICATIONS FOR SURGERY: Vanessa Newman is a 82 y.o. year old female with a long history of progressive hip and groin  pain. X-rays demonstrated severe degenerative changes. The patient had not seen any significant improvement despite conservative nonsurgical intervention. After discussion of the risks and benefits of surgical intervention, the patient expressed understanding of the risks benefits and agree with plans for total hip arthroplasty.   The risks, benefits, and alternatives were discussed at length including but not limited to the risks of infection, bleeding, nerve injury, stiffness, blood clots, the need for revision surgery, limb length inequality, dislocation, cardiopulmonary complications, among others, and they were willing to proceed.  PROCEDURE IN DETAIL: The patient was brought into the operating room and, after adequate spinal anesthesia was achieved, the patient was placed in a right lateral decubitus position. Axillary roll was placed and all bony prominences were well-padded. The patient's left hip was cleaned and prepped with alcohol and DuraPrep and draped in the usual sterile fashion. A "timeout"  was performed as per usual protocol. A lateral curvilinear incision was made gently curving towards the posterior superior iliac spine. The IT band was incised in line with the skin incision and the fibers of the gluteus maximus were split in line. The piriformis tendon was identified, skeletonized, and incised at its insertion to the proximal femur and reflected posteriorly. A T type posterior capsulotomy was performed. Prior to dislocation of the femoral head, a threaded Steinmann pin was inserted through a separate stab incision into the pelvis superior to the acetabulum and bent in the form of a stylus so as to assess limb length and hip offset throughout the procedure. The femoral head was then dislocated posteriorly. Inspection of the femoral head demonstrated severe degenerative changes with full-thickness loss of articular cartilage. The femoral neck cut was performed using an oscillating saw. The anterior capsule was elevated off of the femoral neck using a periosteal elevator. Attention was then directed to the acetabulum. The remnant of the labrum was excised using electrocautery. Inspection of the acetabulum also demonstrated significant degenerative changes. The acetabulum was reamed in sequential fashion up to a 53 mm diameter. Good punctate bleeding bone was encountered. A 54 mm Pinnacle GRIPTION Sector acetabular component was positioned and impacted into place. Good scratch fit was appreciated. A neutral polyethylene trial was inserted.  Attention was then directed to the proximal femur.  Femoral broaches were inserted in a sequential fashion up to a size 4 broach. Calcar region was planed and a trial reduction was performed using a standard offset neck and a 36 mm hip ball with a +1.5 mm neck length. Good equalization of limb lengths and hip offset was appreciated and excellent stability was noted both anteriorly and posteriorly. Trial  components were removed. The acetabular shell was irrigated  with copious amounts of normal saline with antibiotic solution and suctioned dry. A neutral Pinnacle Altrx polyethylene insert was positioned and impacted into place. Next, a size 4 standard offset Actis femoral stem was positioned and impacted into place. Excellent scratch fit was appreciated. A trial reduction was again performed with a 36 mm hip ball with a +1.5 mm neck length. Again, good equalization of limb lengths was appreciated and excellent stability appreciated both anteriorly and posteriorly. The hip was then dislocated and the trial hip ball was removed. The Morse taper was cleaned and dried. A 36 mm M-SPEC hip ball with a +1.5 mm neck length was placed on the trunnion and impacted into place. The hip was then reduced and placed through range of motion. Excellent stability was appreciated both anteriorly and posteriorly.  The wound was irrigated with copious amounts of normal saline followed by 450 ml of Surgiphor and suctioned dry. Good hemostasis was appreciated. The posterior capsulotomy was repaired using #5 Ethibond. Piriformis tendon was reapproximated to the undersurface of the gluteus medius tendon using #5 Ethibond. The IT band was reapproximated using interrupted sutures of #1 Vicryl. Subcutaneous tissue was approximated using first #0 Vicryl followed by #2-0 Vicryl. The skin was closed with skin staples.  The patient tolerated the procedure well and was transported to the recovery room in stable condition.   Jena Gauss., M.D.

## 2023-01-29 NOTE — Plan of Care (Signed)
  Problem: Pain Management: Goal: Pain level will decrease with appropriate interventions Outcome: Progressing   Problem: Skin Integrity: Goal: Will show signs of wound healing Outcome: Progressing   

## 2023-01-29 NOTE — Transfer of Care (Signed)
Immediate Anesthesia Transfer of Care Note  Patient: Vanessa Newman  Procedure(s) Performed: TOTAL HIP ARTHROPLASTY (Left: Hip)  Patient Location: PACU  Anesthesia Type:Spinal  Level of Consciousness: drowsy  Airway & Oxygen Therapy: Patient Spontanous Breathing and Patient connected to face mask oxygen  Post-op Assessment: Report given to RN and Post -op Vital signs reviewed and stable  Post vital signs: Reviewed  Last Vitals:  Vitals Value Taken Time  BP 108/67 01/29/23 1110  Temp 75F   Pulse 70 01/29/23 1112  Resp 13 01/29/23 1112  SpO2 100 % 01/29/23 1112  Vitals shown include unfiled device data.  Last Pain:  Vitals:   01/29/23 0624  TempSrc: Temporal  PainSc: 0-No pain         Complications: No notable events documented.

## 2023-01-29 NOTE — Interval H&P Note (Signed)
History and Physical Interval Note:  01/29/2023 6:34 AM  Vanessa Newman  has presented today for surgery, with the diagnosis of PRIMARY OSTEOARTHRITIS OF LEFT HIP.  The various methods of treatment have been discussed with the patient and family. After consideration of risks, benefits and other options for treatment, the patient has consented to  Procedure(s): TOTAL HIP ARTHROPLASTY (Left) as a surgical intervention.  The patient's history has been reviewed, patient examined, no change in status, stable for surgery.  I have reviewed the patient's chart and labs.  Questions were answered to the patient's satisfaction.     Kayvion Arneson P Markale Birdsell

## 2023-01-29 NOTE — Evaluation (Signed)
Physical Therapy Evaluation Patient Details Name: Vanessa Newman MRN: [id number] DOB: 07/28/1940 Today's Date: 01/29/2023  History of Present Illness  82 y/o female s/p L THA posterior approach on 01/29/23. PMH: HTN, neuropathy, depression  Clinical Impression  Patient admitted following the above procedure. PTA, patient lives with husband who can assist at discharge. Patient was using a cane prior to surgery. Educated patient on posterior hip precautions and WB precautions, patient demonstrated understanding, but will follow up for recall at next session. Patient requires CGA for sit to stand and step pivot transfer with RW. Limited mobility due to decreased sensation per patient report. Provided patient with HEP handout and instructed patient on ankle pumps, glute sets, quad sets, and hip adduction squeeze. Patient will benefit from skilled PT services during acute stay to address listed deficits. Patient will benefit from ongoing therapy at discharge to maximize functional independence and safety.         If plan is discharge home, recommend the following: A little help with walking and/or transfers;A little help with bathing/dressing/bathroom;Assistance with cooking/housework;Assist for transportation;Help with stairs or ramp for entrance   Can travel by private vehicle        Equipment Recommendations Rolling Javae Braaten (2 wheels);BSC/3in1  Recommendations for Other Services       Functional Status Assessment Patient has had a recent decline in their functional status and demonstrates the ability to make significant improvements in function in a reasonable and predictable amount of time.     Precautions / Restrictions Precautions Precautions: Posterior Hip;Fall Precaution Booklet Issued: Yes (comment) Restrictions Weight Bearing Restrictions: Yes LLE Weight Bearing: Weight bearing as tolerated      Mobility  Bed Mobility Overal bed mobility: Modified Independent                   Transfers Overall transfer level: Needs assistance Equipment used: Rolling Mauri Temkin (2 wheels) Transfers: Sit to/from Stand, Bed to chair/wheelchair/BSC Sit to Stand: Contact guard assist   Step pivot transfers: Contact guard assist       General transfer comment: able to stand from EOB with cues for hand placement. Took steps towards recliner with RW. Deferred further mobility due to patient reporting feet not feeling back to normal    Ambulation/Gait                  Stairs            Wheelchair Mobility     Tilt Bed    Modified Rankin (Stroke Patients Only)       Balance Overall balance assessment: Needs assistance Sitting-balance support: Feet supported, No upper extremity supported Sitting balance-Leahy Scale: Good     Standing balance support: Bilateral upper extremity supported, Reliant on assistive device for balance Standing balance-Leahy Scale: Fair                               Pertinent Vitals/Pain Pain Assessment Pain Assessment: No/denies pain    Home Living Family/patient expects to be discharged to:: Private residence Living Arrangements: Spouse/significant other Available Help at Discharge: Family;Available 24 hours/day Type of Home: House Home Access: Ramped entrance       Home Layout: One level Home Equipment: Cane - single point      Prior Function Prior Level of Function : Independent/Modified Independent             Mobility Comments: used SPC recent to admission  Extremity/Trunk Assessment   Upper Extremity Assessment Upper Extremity Assessment: Overall WFL for tasks assessed    Lower Extremity Assessment Lower Extremity Assessment: LLE deficits/detail;Generalized weakness LLE Deficits / Details: Able to perform SLR in supine    Cervical / Trunk Assessment Cervical / Trunk Assessment: Kyphotic  Communication   Communication Communication: No apparent difficulties  Cognition  Arousal: Alert Behavior During Therapy: WFL for tasks assessed/performed Overall Cognitive Status: Within Functional Limits for tasks assessed                                          General Comments      Exercises Other Exercises Other Exercises: HEP handout provided. Instructed patient on ankle pumps, glute sets, quad sets, and hip adduction squeeze   Assessment/Plan    PT Assessment Patient needs continued PT services  PT Problem List Decreased strength;Decreased activity tolerance;Decreased range of motion;Decreased mobility;Decreased balance;Decreased knowledge of use of DME;Decreased knowledge of precautions       PT Treatment Interventions DME instruction;Gait training;Functional mobility training;Therapeutic activities;Therapeutic exercise;Balance training;Patient/family education    PT Goals (Current goals can be found in the Care Plan section)  Acute Rehab PT Goals Patient Stated Goal: to get stronger PT Goal Formulation: With patient Time For Goal Achievement: 02/12/23 Potential to Achieve Goals: Good    Frequency BID     Co-evaluation               AM-PAC PT "6 Clicks" Mobility  Outcome Measure Help needed turning from your back to your side while in a flat bed without using bedrails?: None Help needed moving from lying on your back to sitting on the side of a flat bed without using bedrails?: None Help needed moving to and from a bed to a chair (including a wheelchair)?: A Little Help needed standing up from a chair using your arms (e.g., wheelchair or bedside chair)?: A Little Help needed to walk in hospital room?: A Little Help needed climbing 3-5 steps with a railing? : A Little 6 Click Score: 20    End of Session   Activity Tolerance: Patient tolerated treatment well Patient left: in chair;with call bell/phone within reach Nurse Communication: Mobility status PT Visit Diagnosis: Unsteadiness on feet (R26.81);Muscle weakness  (generalized) (M62.81);Other abnormalities of gait and mobility (R26.89)    Time: 1440-1502 PT Time Calculation (min) (ACUTE ONLY): 22 min   Charges:   PT Evaluation $PT Eval Low Complexity: 1 Low   PT General Charges $$ ACUTE PT VISIT: 1 Visit         Maylon Peppers, PT, DPT Physical Therapist - [facility]  [facility]   Earsie Humm A Earlie Arciga 01/29/2023, 3:39 PM

## 2023-01-29 NOTE — TOC Progression Note (Signed)
Transition of Care [facility]) - Progression Note    Patient Details  Name: Vanessa Newman MRN: [id number] Date of Birth: [birth year]  Transition of Care [facility]) CM/SW Contact  Marlowe Sax, RN Phone Number: 01/29/2023, 1:54 PM  Clinical Narrative:    Patient is set up with [facility] for [facility] prior to surgery by surgeons office \\she  needs a RW Adpat to deliver to the bedside, \She has a 3 in1   Expected Discharge Plan: Home w Home Health Services Barriers to Discharge: No Barriers Identified  Expected Discharge Plan and Services   Discharge Planning Services: CM Consult   Living arrangements for the past 2 months: Single Family Home                 DME Arranged: Walker rolling DME Agency: [employer] Date DME Agency Contacted: 01/29/23 Time DME Agency Contacted: 1353 Representative spoke with at DME Agency: Cletis [place] HH Arranged: PT, OT [facility] Agency: [facility] Date [facility] Agency Contacted: 01/29/23 Time HH Agency Contacted: 1354 Representative spoke with at [facility] Agency: [place]   Social Determinants of Health (SDOH) Interventions SDOH Screenings   Food Insecurity: No Food Insecurity (01/29/2023)  Housing: Low Risk  (01/29/2023)  Transportation Needs: No Transportation Needs (01/29/2023)  Utilities: Not At Risk (01/29/2023)  Tobacco Use: Low Risk  (01/29/2023)    Readmission Risk Interventions     No data to display

## 2023-01-29 NOTE — Progress Notes (Signed)
Patient is not able to walk the distance required to go the bathroom, or he/she is unable to safely negotiate stairs required to access the bathroom.  A 3in1 BSC will alleviate this problem   James P. Hooten, Jr. M.D.  

## 2023-01-29 NOTE — Anesthesia Preprocedure Evaluation (Addendum)
Anesthesia Evaluation  Patient identified by MRN, date of birth, ID band Patient awake    Reviewed: Allergy & Precautions, NPO status , Patient's Chart, lab work & pertinent test results  History of Anesthesia Complications (+) history of anesthetic complications (BP dropped during hip surgery)  Airway Mallampati: III  TM Distance: >3 FB Neck ROM: full    Dental no notable dental hx.    Pulmonary asthma    Pulmonary exam normal        Cardiovascular hypertension, Pt. on medications Normal cardiovascular exam  Echo 2021 IMPRESSIONS     1. Left ventricular ejection fraction, by estimation, is 60 to 65%. The  left ventricle has normal function. The left ventricle has no regional  wall motion abnormalities. Left ventricular diastolic parameters are  consistent with Grade I diastolic  dysfunction (impaired relaxation). The average left ventricular global  longitudinal strain is -16.8 %.   2. Right ventricular systolic function is normal. The right ventricular  size is normal. Tricuspid regurgitation signal is inadequate for assessing  PA pressure.      Neuro/Psych  PSYCHIATRIC DISORDERS Anxiety Depression     Neuromuscular disease    GI/Hepatic Neg liver ROS, hiatal hernia,GERD  Controlled,,  Endo/Other  negative endocrine ROS    Renal/GU  Bladder dysfunction      Musculoskeletal  (+) Arthritis ,    Abdominal   Peds  Hematology  (+) Blood dyscrasia, anemia   Anesthesia Other Findings Past Medical History: No date: Acid reflux No date: Anxiety and depression No date: Arthritis No date: Asthma No date: Colon polyps No date: Complication of anesthesia     Comment:  bp dropped during hip surgery and ended up in CCU for 3               days after surgery No date: Cystocele     Comment:  2nd degree No date: Fibroids No date: History of hiatal hernia No date: Hypercholesteremia No date: Hypertension No  date: IDA (iron deficiency anemia) No date: Left lower quadrant pain No date: Melanoma (HCC) No date: 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 No date: Pre-diabetes No date: Rectocele     Comment:  2nd degree No date: SUI (stress urinary incontinence, female) No date: Urinary incontinence No date: Vaginal atrophy  Past Surgical History: No date: CHOLECYSTECTOMY 07/27/2021: COLONOSCOPY WITH PROPOFOL; N/A     Comment:  Procedure: COLONOSCOPY WITH PROPOFOL;  Surgeon:               Regis Bill, MD;  Location: [facility] ENDOSCOPY;                Service: Endoscopy;  Laterality: N/A; 07/27/2021: ESOPHAGOGASTRODUODENOSCOPY (EGD) WITH PROPOFOL; N/A     Comment:  Procedure: ESOPHAGOGASTRODUODENOSCOPY (EGD) WITH               PROPOFOL;  Surgeon: Regis Bill, MD;  Location:               [facility] ENDOSCOPY;  Service: Endoscopy;  Laterality: N/A; No date: KNEE ARTHROSCOPY; Bilateral No date: ORIF FEMUR FRACTURE 2010: right knee replacement; Right 2023: ROBOTIC ASSISTED LAPAROSCOPIC REPAIR OF PARAESOPHAGEAL HERNIA No date: SPINE SURGERY 2013: TOTAL HIP ARTHROPLASTY; Right No date: TUBAL LIGATION  BMI    Body Mass Index: 21.32 kg/m      Reproductive/Obstetrics negative OB ROS  Anesthesia Physical Anesthesia Plan  ASA: 2  Anesthesia Plan: Spinal   Post-op Pain Management: Celebrex PO (pre-op)*, Gabapentin PO (pre-op)*, Ofirmev IV (intra-op)* and Regional block*   Induction:   PONV Risk Score and Plan: 2 and Propofol infusion, TIVA and Treatment may vary due to age or medical condition  Airway Management Planned: Natural Airway and Nasal Cannula  Additional Equipment:   Intra-op Plan:   Post-operative Plan:   Informed Consent: I have reviewed the patients History and Physical, chart, labs and discussed the procedure including the risks, benefits and alternatives for the proposed anesthesia  with the patient or authorized representative who has indicated his/her understanding and acceptance.     Dental Advisory Given  Plan Discussed with: Anesthesiologist, CRNA and Surgeon  Anesthesia Plan Comments: (Patient reports no bleeding problems and no anticoagulant use.  Plan for spinal with backup GA  Patient consented for risks of anesthesia including but not limited to:  - adverse reactions to medications - damage to eyes, teeth, lips or other oral mucosa - nerve damage due to positioning  - risk of bleeding, infection and or nerve damage from spinal that could lead to paralysis - risk of headache or failed spinal - damage to teeth, lips or other oral mucosa - sore throat or hoarseness - damage to heart, brain, nerves, lungs, other parts of body or loss of life  Patient voiced understanding.)        Anesthesia Quick Evaluation

## 2023-01-29 NOTE — Anesthesia Procedure Notes (Addendum)
Spinal  Patient location during procedure: OR Start time: 01/29/2023 7:27 AM End time: 01/29/2023 7:31 AM Reason for block: surgical anesthesia Staffing Performed: resident/CRNA  Anesthesiologist: Louie Boston, MD Resident/CRNA: Merlene Pulling, CRNA Performed by: Merlene Pulling, CRNA Authorized by: Louie Boston, MD   Preanesthetic Checklist Completed: patient identified, IV checked, site marked, risks and benefits discussed, surgical consent, monitors and equipment checked, pre-op evaluation and timeout performed Spinal Block Patient position: sitting Prep: DuraPrep Patient monitoring: heart rate, cardiac monitor, continuous pulse ox and blood pressure Approach: midline Location: L4-5 Injection technique: single-shot Needle Needle type: Sprotte  Needle gauge: 24 G Needle length: 9 cm Assessment Sensory level: T4 Events: CSF return Additional Notes Hand hygiene preformed prior to start of procedure.   Spinal Kit LKGM:[id number] 2024-11-07 LOT [id number]  0.5% Bupivacaine  LOT [id number] EXP 2028/02 Medication drawn up sterile with Kirt Boys, RN.

## 2023-01-30 DIAGNOSIS — M1612 Unilateral primary osteoarthritis, left hip: Secondary | ICD-10-CM | POA: Diagnosis not present

## 2023-01-30 MED ORDER — ACETAMINOPHEN 10 MG/ML IV SOLN
INTRAVENOUS | Status: AC
  Filled 2023-01-30: qty 100

## 2023-01-30 MED ORDER — FERROUS SULFATE 325 (65 FE) MG PO TABS
ORAL_TABLET | ORAL | Status: AC
  Filled 2023-01-30: qty 1

## 2023-01-30 MED ORDER — OXYCODONE HCL 5 MG PO TABS: 5.0000 mg | ORAL_TABLET | ORAL | 0 refills | Status: AC | PRN

## 2023-01-30 MED ORDER — TRIAMTERENE-HCTZ 37.5-25 MG PO TABS
ORAL_TABLET | ORAL | Status: AC
  Filled 2023-01-30: qty 1

## 2023-01-30 MED ORDER — CELECOXIB 200 MG PO CAPS: 200.0000 mg | ORAL_CAPSULE | Freq: Two times a day (BID) | ORAL | 1 refills | Status: DC

## 2023-01-30 MED ORDER — SENNOSIDES-DOCUSATE SODIUM 8.6-50 MG PO TABS
ORAL_TABLET | ORAL | Status: AC
  Filled 2023-01-30: qty 1

## 2023-01-30 MED ORDER — CELECOXIB 200 MG PO CAPS: 200.0000 mg | ORAL_CAPSULE | Freq: Two times a day (BID) | ORAL | 1 refills | Status: AC

## 2023-01-30 MED ORDER — ATORVASTATIN CALCIUM 20 MG PO TABS
ORAL_TABLET | ORAL | Status: AC
  Filled 2023-01-30: qty 2

## 2023-01-30 MED ORDER — METOCLOPRAMIDE HCL 10 MG PO TABS
ORAL_TABLET | ORAL | Status: AC
  Filled 2023-01-30: qty 1

## 2023-01-30 MED ORDER — ASPIRIN 81 MG PO CHEW: 81.0000 mg | CHEWABLE_TABLET | Freq: Two times a day (BID) | ORAL | Status: DC

## 2023-01-30 MED ORDER — ASPIRIN 81 MG PO CHEW: 81.0000 mg | CHEWABLE_TABLET | Freq: Two times a day (BID) | ORAL | Status: AC

## 2023-01-30 MED ORDER — ASPIRIN 81 MG PO CHEW
CHEWABLE_TABLET | ORAL | Status: AC
  Filled 2023-01-30: qty 1

## 2023-01-30 MED ORDER — MAGNESIUM HYDROXIDE 400 MG/5ML PO SUSP
ORAL | Status: AC
  Filled 2023-01-30: qty 30

## 2023-01-30 MED ORDER — CELECOXIB 200 MG PO CAPS
ORAL_CAPSULE | ORAL | Status: AC
  Filled 2023-01-30: qty 1

## 2023-01-30 MED ORDER — PANTOPRAZOLE SODIUM 40 MG PO TBEC
DELAYED_RELEASE_TABLET | ORAL | Status: AC
  Filled 2023-01-30: qty 1

## 2023-01-30 MED ORDER — OXYCODONE HCL 5 MG PO TABS: 5.0000 mg | ORAL_TABLET | ORAL | 0 refills | Status: DC | PRN

## 2023-01-30 NOTE — Evaluation (Signed)
Occupational Therapy Evaluation Patient Details Name: Vanessa Newman MRN: [id number] DOB: 1940-12-04 Today's Date: 01/30/2023   History of Present Illness 82 y/o female s/p L THA posterior approach on 01/29/23. PMH: HTN, neuropathy, depression   Clinical Impression   Upon entering the room, pt seated in recliner chair and agreeable to OT evaluation. Pt verbalized posterior hip precautions and OT educated pt further on precautions related to functional transfers and self care tasks. Pt dons pull over shirt with set up A to obtain needed items. Pt dons pants with assistance to thread over L foot in order to maintain precautions. She has a LH reacher at home she plans on utilizing for this tasks at discharge along with family present to assist as needed. Pt returning to sit in recliner chair at end of session. Pt with no further questions at this time. Call bell and all needed items within reach upon exiting the room. OT to complete orders.      If plan is discharge home, recommend the following: A little help with walking and/or transfers;A little help with bathing/dressing/bathroom;Assistance with cooking/housework;Assist for transportation;Help with stairs or ramp for entrance    Functional Status Assessment  Patient has not had a recent decline in their functional status  Equipment Recommendations  Other (comment) (RW)       Precautions / Restrictions Precautions Precautions: Posterior Hip;Fall Precaution Booklet Issued: Yes (comment) Restrictions Weight Bearing Restrictions: Yes LLE Weight Bearing: Weight bearing as tolerated      Mobility Bed Mobility               General bed mobility comments: seated in recliner chair    Transfers Overall transfer level: Modified independent Equipment used: Rolling walker (2 wheels) Transfers: Sit to/from Stand, Bed to chair/wheelchair/BSC Sit to Stand: Supervision     Step pivot transfers: Supervision            Balance  Overall balance assessment: Needs assistance Sitting-balance support: Feet supported, No upper extremity supported Sitting balance-Leahy Scale: Good     Standing balance support: Bilateral upper extremity supported, Reliant on assistive device for balance Standing balance-Leahy Scale: Fair                             ADL either performed or assessed with clinical judgement   ADL Overall ADL's : Needs assistance/impaired                                             Vision Patient Visual Report: No change from baseline              Pertinent Vitals/Pain Pain Assessment Pain Assessment: No/denies pain     Extremity/Trunk Assessment Upper Extremity Assessment Upper Extremity Assessment: Overall WFL for tasks assessed   Lower Extremity Assessment Lower Extremity Assessment: Generalized weakness       Communication Communication Communication: No apparent difficulties   Cognition Arousal: Alert Behavior During Therapy: WFL for tasks assessed/performed Overall Cognitive Status: Within Functional Limits for tasks assessed                                                  Home Living Family/patient expects to be discharged  to:: Private residence Living Arrangements: Spouse/significant other Available Help at Discharge: Family;Available 24 hours/day Type of Home: House Home Access: Ramped entrance     Home Layout: One level     Bathroom Shower/Tub: Producer, television/film/video: Handicapped height     Home Equipment: Cane - single point          Prior Functioning/Environment Prior Level of Function : Independent/Modified Independent                                 OT Goals(Current goals can be found in the care plan section) Acute Rehab OT Goals Patient Stated Goal: to go home OT Goal Formulation: With patient Time For Goal Achievement: 01/30/23 Potential to Achieve Goals: Fair  OT  Frequency:         AM-PAC OT "6 Clicks" Daily Activity     Outcome Measure Help from another person eating meals?: None Help from another person taking care of personal grooming?: None Help from another person toileting, which includes using toliet, bedpan, or urinal?: A Little Help from another person bathing (including washing, rinsing, drying)?: A Little Help from another person to put on and taking off regular upper body clothing?: None Help from another person to put on and taking off regular lower body clothing?: A Little 6 Click Score: 21   End of Session Equipment Utilized During Treatment: Rolling walker (2 wheels) Nurse Communication: Mobility status  Activity Tolerance: Patient tolerated treatment well Patient left: in bed;with call bell/phone within reach;with bed alarm set                   Time: 3086-5784 OT Time Calculation (min): 14 min Charges:  OT General Charges $OT Visit: 1 Visit OT Evaluation $OT Eval Low Complexity: 1 Low  Jackquline Denmark, MS, OTR/L , CBIS ascom [phone]  01/30/23, 11:34 AM

## 2023-01-30 NOTE — Progress Notes (Signed)
  Subjective: 1 Day Post-Op Procedure(s) (LRB): TOTAL HIP ARTHROPLASTY (Left) Patient reports pain as mild.   Patient is well, and has had no acute complaints or problems Plan is to go Home after hospital stay. Negative for chest pain and shortness of breath Fever: no Gastrointestinal:Negative for nausea and vomiting Reports she is passing some gas this morning.  Objective: Vital signs in last 24 hours: Temp:  [97 F (36.1 C)-98.1 F (36.7 C)] 97.8 F (36.6 C) (08/22 0639) Pulse Rate:  [61-76] 61 (08/22 0639) Resp:  [13-23] 15 (08/22 0639) BP: (94-166)/(54-139) 109/64 (08/22 0639) SpO2:  [98 %-100 %] 98 % (08/22 0639)  Intake/Output from previous day:  Intake/Output Summary (Last 24 hours) at 01/30/2023 0731 Last data filed at 01/30/2023 0630 Gross per 24 hour  Intake 2867.06 ml  Output 470 ml  Net 2397.06 ml    Intake/Output this shift: No intake/output data recorded.  Labs: Recent Labs    01/29/23 0643  HGB 11.2*   Recent Labs    01/29/23 0643  HCT 33.0*   Recent Labs    01/29/23 0643  NA 141  K 3.6  CL 105  BUN 18  CREATININE 0.90  GLUCOSE 98   No results for input(s): "LABPT", "INR" in the last 72 hours.   EXAM General - Patient is Alert, Appropriate, and Oriented Extremity - ABD soft Neurovascular intact Dorsiflexion/Plantar flexion intact Incision: dressing C/D/I No cellulitis present Compartment soft Dressing/Incision - No drainage noted to the left hip aquacel dressing.  Hemovac with minimal bloody drainage.  Drain was removed this AM without issue.  4x4 with tegaderm applied. Motor Function - intact, moving foot and toes well on exam.  Abdomen soft with intact bowel sounds this AM.  Past Medical History:  Diagnosis Date   Acid reflux    Anxiety and depression    Arthritis    Asthma    Colon polyps    Complication of anesthesia    bp dropped during hip surgery and ended up in CCU for 3 days after surgery   Cystocele    2nd degree    Fibroids    History of hiatal hernia    Hypercholesteremia    Hypertension    IDA (iron deficiency anemia)    Left lower quadrant pain    Melanoma (HCC)    Mixed incontinence urge and stress    Plantar fibromatosis    Pre-diabetes    Rectocele    2nd degree   SUI (stress urinary incontinence, female)    Urinary incontinence    Vaginal atrophy     Assessment/Plan: 1 Day Post-Op Procedure(s) (LRB): TOTAL HIP ARTHROPLASTY (Left) Principal Problem:   Hx of total hip arthroplasty, left  Estimated body mass index is 21.32 kg/m as calculated from the following:   Height as of this encounter: 5\' 3"  (1.6 m).   Weight as of this encounter: 54.6 kg. Advance diet Up with therapy D/C IV fluids when tolerating po intake.  Vitals reviewed this AM.  No recent fevers, HR 61. 170cc drainage reported overnight into the hemovac, removed this morning without issue. Up with therapy today.  Plan for discharge home with HHPT.  Supportive family around.  Has ramp already built. Plan for discharge home today pending progress with PT.  DVT Prophylaxis - Aspirin and TED hose Weight-Bearing as tolerated to left leg  J. Horris Latino, PA-C [facility] 01/30/2023, 7:31 AM

## 2023-01-30 NOTE — Anesthesia Postprocedure Evaluation (Signed)
Anesthesia Post Note  Patient: Vanessa Newman  Procedure(s) Performed: TOTAL HIP ARTHROPLASTY (Left: Hip)  Patient location during evaluation: Nursing Unit Anesthesia Type: Spinal Level of consciousness: oriented and awake and alert Pain management: pain level controlled Vital Signs Assessment: post-procedure vital signs reviewed and stable Respiratory status: spontaneous breathing and respiratory function stable Cardiovascular status: blood pressure returned to baseline and stable Postop Assessment: no headache, no backache, no apparent nausea or vomiting and patient able to bend at knees Anesthetic complications: no  No notable events documented.   Last Vitals:  Vitals:   01/30/23 0639 01/30/23 0732  BP: 109/64 (!) 102/56  Pulse: 61 (!) 58  Resp: 15 15  Temp: 36.6 C 36.5 C  SpO2: 98% 96%    Last Pain:  Vitals:   01/30/23 0639  TempSrc: Oral  PainSc:                  Elmarie Mainland

## 2023-01-30 NOTE — Discharge Summary (Addendum)
Physician Discharge Summary  Patient ID: Vanessa Newman MRN: [id number] DOB/AGE: Nov 17, 1940 82 y.o.  Admit date: 01/29/2023 Discharge date: 01/30/2023  Admission Diagnoses:  Hx of total hip arthroplasty, left [W09.811] Primary degenerative arthrosis of the left hip.  Discharge Diagnoses: Patient Active Problem List   Diagnosis Date Noted   Hx of total hip arthroplasty, left 01/29/2023   Prediabetes 11/27/2021   Hiatal hernia 11/27/2021   Fecal smearing 01/26/2019   Hx of adenomatous colonic polyps 01/26/2019   IDA (iron deficiency anemia) 01/26/2019   History of chest pain 08/04/2018   Mixed incontinence urge and stress 05/30/2015   Exertional dyspnea 01/12/2014   Chest pain 01/12/2014   Personal history of other malignant neoplasm of skin 09/27/2013   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   Synovitis 09/09/2013   Myofascial muscle pain 09/09/2013   Lumbar spinal stenosis 09/09/2013   Hyperlipidemia, unspecified 09/09/2013   HTN (hypertension) 09/09/2013   GERD (gastroesophageal reflux disease) 09/09/2013   Depression 09/09/2013   RLS (restless legs syndrome) 09/09/2013    Past Medical History:  Diagnosis Date   Acid reflux    Anxiety and depression    Arthritis    Asthma    Colon polyps    Complication of anesthesia    bp dropped during hip surgery and ended up in CCU for 3 days after surgery   Cystocele    2nd degree   Fibroids    History of hiatal hernia    Hypercholesteremia    Hypertension    IDA (iron deficiency anemia)    Left lower quadrant pain    Melanoma (HCC)    Mixed incontinence urge and stress    Plantar fibromatosis    Pre-diabetes    Rectocele    2nd degree   SUI (stress urinary incontinence, female)    Urinary incontinence    Vaginal atrophy      Transfusion: None.   Consultants (if any):   Discharged Condition: Improved  Hospital Course: Vanessa Newman is an 82 y.o. female who was admitted 01/29/2023 with a diagnosis of primary  degenerative arthrosis of the left hip and went to the operating room on 01/29/2023 and underwent the above named procedures.    Surgeries: Procedure(s): TOTAL HIP ARTHROPLASTY on 01/29/2023 Patient tolerated the surgery well. Taken to PACU where she was stabilized and then transferred to the post operative recovery area.  Started on aspirin 81mg  BID. Heels elevated on bed with rolled towels. No evidence of DVT. Negative Homan. Physical therapy started on day #1 for gait training and transfer. OT started day #1 for ADL and assisted devices.  Patient's IV and hemovac were removed on POD1.  Foley was removed shortly following surgery.  Implants: DePuy size 4 standard offset Actis femoral stem, 54 mm OD Pinnacle GRIPTION Sector acetabular component, neutral Pinnacle Altrx polyethylene insert, and a 36 mm M-SPEC +1.5 mm hip ball   She was given perioperative antibiotics:  Anti-infectives (From admission, onward)    Start     Dose/Rate Route Frequency Ordered Stop   01/29/23 1400  ceFAZolin (ANCEF) IVPB 2g/100 mL premix        2 g 200 mL/hr over 30 Minutes Intravenous Every 6 hours 01/29/23 1231 01/29/23 2041   01/29/23 0700  ceFAZolin (ANCEF) IVPB 2g/100 mL premix        2 g 200 mL/hr over 30 Minutes Intravenous On call to O.R. 01/29/23 9147 01/29/23 0755     .  She was given sequential compression devices, early  ambulation, and Aspirin for DVT prophylaxis.  She benefited maximally from the hospital stay and there were no complications.    Recent vital signs:  Vitals:   01/30/23 0639 01/30/23 0732  BP: 109/64 (!) 102/56  Pulse: 61 (!) 58  Resp: 15 15  Temp: 97.8 F (36.6 C) 97.7 F (36.5 C)  SpO2: 98% 96%    Recent laboratory studies:  Lab Results  Component Value Date   HGB 11.2 (L) 01/29/2023   HGB 13.4 07/08/2021   HGB 13.3 07/08/2021   Lab Results  Component Value Date   WBC 7.8 07/08/2021   WBC 7.7 07/08/2021   PLT 475 (H) 07/08/2021   PLT 496 (H) 07/08/2021    Lab Results  Component Value Date   INR 0.9 08/13/2011   Lab Results  Component Value Date   NA 141 01/29/2023   K 3.6 01/29/2023   CL 105 01/29/2023   CO2 29 01/21/2023   BUN 18 01/29/2023   CREATININE 0.90 01/29/2023   GLUCOSE 98 01/29/2023    Discharge Medications:   Allergies as of 01/30/2023       Reactions   Prednisone Anxiety   Pt reports it makes her have suicidal thoughts        Medication List     STOP taking these medications    HYDROcodone-acetaminophen 5-325 MG tablet Commonly known as: NORCO/VICODIN   ibuprofen 600 MG tablet Commonly known as: ADVIL       TAKE these medications    aspirin 81 MG chewable tablet Chew 1 tablet (81 mg total) by mouth 2 (two) times daily.   atorvastatin 40 MG tablet Commonly known as: LIPITOR Take 40 mg by mouth every morning.   buPROPion 150 MG 24 hr tablet Commonly known as: WELLBUTRIN XL Take 150 mg by mouth every morning.   calcium carbonate 600 MG Tabs tablet Commonly known as: OS-CAL Take 600 mg by mouth daily with breakfast.   celecoxib 200 MG capsule Commonly known as: CELEBREX Take 1 capsule (200 mg total) by mouth 2 (two) times daily.   citalopram 20 MG tablet Commonly known as: CELEXA Take 20 mg by mouth every morning.   diclofenac Sodium 1 % Gel Commonly known as: VOLTAREN Apply 2 g topically 4 (four) times daily. What changed:  when to take this reasons to take this   fluticasone furoate-vilanterol 100-25 MCG/ACT Aepb Commonly known as: BREO ELLIPTA Inhale 1 puff into the lungs every morning.   gabapentin 800 MG tablet Commonly known as: NEURONTIN Take 800 mg by mouth 3 (three) times daily.   oxyCODONE 5 MG immediate release tablet Commonly known as: Oxy IR/ROXICODONE Take 1 tablet (5 mg total) by mouth every 4 (four) hours as needed for moderate pain (pain score 4-6).   pantoprazole 40 MG tablet Commonly known as: PROTONIX Take 40 mg by mouth every morning.   Premarin  vaginal cream Generic drug: conjugated estrogens Place 1 applicator vaginally 2 (two) times a week.   PRESERVISION AREDS PO Take 1 tablet by mouth every morning.   senna 8.6 MG Tabs tablet Commonly known as: SENOKOT Take 1 tablet by mouth daily as needed.   triamterene-hydrochlorothiazide 37.5-25 MG capsule Commonly known as: DYAZIDE Take 1 capsule by mouth every morning.   vitamin C 250 MG tablet Commonly known as: ASCORBIC ACID Take 250 mg by mouth daily.   Vitamin D (Cholecalciferol) 10 MCG (400 UNIT) Chew Chew 400 Units by mouth daily.  Durable Medical Equipment  (From admission, onward)           Start     Ordered   01/29/23 1232  DME Walker rolling  Once       Question:  Patient needs a walker to treat with the following condition  Answer:  S/P total hip arthroplasty   01/29/23 1231   01/29/23 1232  DME Bedside commode  Once       Comments: Patient is not able to walk the distance required to go the bathroom, or he/she is unable to safely negotiate stairs required to access the bathroom.  A 3in1 BSC will alleviate this problem  Question:  Patient needs a bedside commode to treat with the following condition  Answer:  S/P total hip arthroplasty   01/29/23 1231           Diagnostic Studies: DG HIP UNILAT W OR W/O PELVIS 2-3 VIEWS LEFT  Result Date: 01/29/2023 CLINICAL DATA:  Left hip replacement EXAM: DG HIP (WITH OR WITHOUT PELVIS) 2-3V LEFT COMPARISON:  None Available. FINDINGS: The patient is status post left hip arthroplasty. Hardware alignment is within expected limits, without evidence of complication there is no perihardware fracture. Postsurgical changes reflecting right hip arthroplasty as well as sideplate and screw fixation of the proximal femur are noted, also without evidence of complication or perihardware fracture. The symphysis pubis is intact. The soft tissues are unremarkable. IMPRESSION: Status post left hip arthroplasty without  evidence of complication. Electronically Signed   By: Lesia Hausen M.D.   On: 01/29/2023 12:32    Disposition: Plan for discharge home today pending progress with PT.   Follow-up Information     Hooten, Illene Labrador, MD Follow up on 03/18/2023.   Specialty: Orthopedic Surgery Why: at 3:00pm Contact information: [address] [facility] [place] [address] [phone]                Signed: Meriel Pica PA-C 01/30/2023, 7:37 AM

## 2023-01-30 NOTE — Progress Notes (Signed)
Pt discharged home per MD's order. Iv discontinued VS WNL. Per PA Janell Quiet, pt to take aspirin 81 mg twice a day for DVT prophylaxis. Pt and Jorja Loa verbalized understanding fully discharge instructions.

## 2023-01-30 NOTE — Plan of Care (Signed)
  Problem: Education: Goal: Knowledge of the prescribed therapeutic regimen will improve Outcome: Progressing   Problem: Activity: Goal: Ability to avoid complications of mobility impairment will improve Outcome: Progressing Goal: Ability to tolerate increased activity will improve Outcome: Progressing   Problem: Pain Management: Goal: Pain level will decrease with appropriate interventions Outcome: Progressing   

## 2023-01-30 NOTE — Progress Notes (Signed)
Physical Therapy Treatment Patient Details Name: Vanessa Newman MRN: [id number] DOB: [birth year] Today's Date: 01/30/2023   History of Present Illness 82 y/o female s/p L THA posterior approach on 01/29/23. PMH: HTN, neuropathy, depression    PT Comments  Patient progressing towards physical therapy goals. Re-educated patient on posterior hip precautions as she is was able to recall 1/3 precautions. Patient able to complete bed mobility and sit to stand modI. Ambulated 200' with RW and supervision with good heel strike and intermittent step to and step through gait pattern. Participated in seated and long sitting exercises. Encouraged continued participation of HEP at discharge. Discharge plan remains appropriate.    If plan is discharge home, recommend the following: A little help with walking and/or transfers;A little help with bathing/dressing/bathroom;Assistance with cooking/housework;Assist for transportation;Help with stairs or ramp for entrance   Can travel by private vehicle        Equipment Recommendations  Rolling Chesley Veasey (2 wheels);BSC/3in1    Recommendations for Other Services       Precautions / Restrictions Precautions Precautions: Posterior Hip;Fall Precaution Booklet Issued: Yes (comment) Restrictions Weight Bearing Restrictions: Yes LLE Weight Bearing: Weight bearing as tolerated     Mobility  Bed Mobility Overal bed mobility: Modified Independent                  Transfers Overall transfer level: Modified independent Equipment used: Rolling Tallan Sandoz (2 wheels)                    Ambulation/Gait Ambulation/Gait assistance: Supervision Gait Distance (Feet): 200 Feet Assistive device: Rolling Nalayah Hitt (2 wheels) Gait Pattern/deviations: Step-to pattern, Step-through pattern, Decreased stride length Gait velocity: decreased     General Gait Details: initially step to pattern but progressed to step through pattern with increasing distance. Good heel  strike throughout   Stairs Stairs:  (deferred due to patient having ramp at home)           Wheelchair Mobility     Tilt Bed    Modified Rankin (Stroke Patients Only)       Balance Overall balance assessment: Needs assistance Sitting-balance support: Feet supported, No upper extremity supported Sitting balance-Leahy Scale: Good     Standing balance support: Bilateral upper extremity supported, Reliant on assistive device for balance Standing balance-Leahy Scale: Fair                              Cognition Arousal: Alert Behavior During Therapy: WFL for tasks assessed/performed Overall Cognitive Status: Within Functional Limits for tasks assessed                                          Exercises Total Joint Exercises Ankle Circles/Pumps: AROM, Both, 10 reps Hip ABduction/ADduction: Both, AROM, 10 reps (long sitting) Long Arc Quad: AROM, Both, 15 reps, Seated    General Comments        Pertinent Vitals/Pain Pain Assessment Pain Assessment: No/denies pain    Home Living                          Prior Function            PT Goals (current goals can now be found in the care plan section) Acute Rehab PT Goals Patient Stated Goal: to go home PT  Goal Formulation: With patient Time For Goal Achievement: 02/12/23 Potential to Achieve Goals: Good Progress towards PT goals: Progressing toward goals    Frequency    BID      PT Plan      Co-evaluation              AM-PAC PT "6 Clicks" Mobility   Outcome Measure  Help needed turning from your back to your side while in a flat bed without using bedrails?: None Help needed moving from lying on your back to sitting on the side of a flat bed without using bedrails?: None Help needed moving to and from a bed to a chair (including a wheelchair)?: None Help needed standing up from a chair using your arms (e.g., wheelchair or bedside chair)?: None Help needed  to walk in hospital room?: None Help needed climbing 3-5 steps with a railing? : A Little 6 Click Score: 23    End of Session   Activity Tolerance: Patient tolerated treatment well Patient left: in chair;with call bell/phone within reach Nurse Communication: Mobility status PT Visit Diagnosis: Unsteadiness on feet (R26.81);Muscle weakness (generalized) (M62.81);Other abnormalities of gait and mobility (R26.89)     Time: 4098-1191 PT Time Calculation (min) (ACUTE ONLY): 15 min  Charges:    $Therapeutic Activity: 8-22 mins PT General Charges $$ ACUTE PT VISIT: 1 Visit                     Maylon Peppers, PT, DPT Physical Therapist - [facility]  [facility] [address]    Diamond Jentz A Ethen Bannan 01/30/2023, 9:09 AM

## 2023-02-24 ENCOUNTER — Encounter: Payer: Self-pay | Admitting: Orthopedic Surgery

## 2023-04-23 ENCOUNTER — Other Ambulatory Visit: Payer: Medicare HMO

## 2023-07-08 ENCOUNTER — Other Ambulatory Visit: Payer: Medicare HMO

## 2023-07-10 ENCOUNTER — Other Ambulatory Visit: Payer: Self-pay | Admitting: Family Medicine

## 2023-07-10 ENCOUNTER — Ambulatory Visit
Admission: RE | Admit: 2023-07-10 | Discharge: 2023-07-10 | Disposition: A | Discharge status: Home / Self Care | Payer: Medicare HMO | Source: Ambulatory Visit | Attending: Family Medicine | Admitting: Family Medicine

## 2023-07-10 DIAGNOSIS — Z780 Asymptomatic menopausal state: Secondary | ICD-10-CM | POA: Diagnosis present

## 2023-07-10 DIAGNOSIS — Z1231 Encounter for screening mammogram for malignant neoplasm of breast: Secondary | ICD-10-CM

## 2024-04-20 ENCOUNTER — Other Ambulatory Visit: Payer: Self-pay | Admitting: Family Medicine

## 2024-04-20 DIAGNOSIS — N183 Chronic kidney disease, stage 3 unspecified: Secondary | ICD-10-CM

## 2024-04-27 ENCOUNTER — Ambulatory Visit
Admission: RE | Admit: 2024-04-27 | Discharge: 2024-04-27 | Disposition: A | Discharge status: Home / Self Care | Source: Ambulatory Visit | Attending: Family Medicine | Admitting: Family Medicine

## 2024-04-27 DIAGNOSIS — N183 Chronic kidney disease, stage 3 unspecified: Secondary | ICD-10-CM | POA: Diagnosis present

## 2024-06-18 ENCOUNTER — Other Ambulatory Visit: Payer: Self-pay | Admitting: Family Medicine

## 2024-06-18 DIAGNOSIS — Z1231 Encounter for screening mammogram for malignant neoplasm of breast: Secondary | ICD-10-CM

## 2024-07-15 ENCOUNTER — Ambulatory Visit
Admission: RE | Admit: 2024-07-15 | Discharge: 2024-07-15 | Disposition: A | Source: Ambulatory Visit | Attending: Family Medicine | Admitting: Family Medicine

## 2024-07-15 DIAGNOSIS — Z1231 Encounter for screening mammogram for malignant neoplasm of breast: Secondary | ICD-10-CM
